# Patient Record
Sex: Female | Born: 1985 | Race: White | Hispanic: No | Marital: Married | Smoking: Former smoker
Health system: Southern US, Community
[De-identification: ages and names within clinical notes are randomized; demographics above are authoritative.]

## PROBLEM LIST (undated history)

## (undated) DIAGNOSIS — G47419 Narcolepsy without cataplexy: Secondary | ICD-10-CM

## (undated) DIAGNOSIS — R4586 Emotional lability: Secondary | ICD-10-CM

## (undated) DIAGNOSIS — Z803 Family history of malignant neoplasm of breast: Secondary | ICD-10-CM

## (undated) DIAGNOSIS — Z1371 Encounter for nonprocreative screening for genetic disease carrier status: Secondary | ICD-10-CM

## (undated) DIAGNOSIS — K5909 Other constipation: Secondary | ICD-10-CM

## (undated) DIAGNOSIS — G4711 Idiopathic hypersomnia with long sleep time: Secondary | ICD-10-CM

## (undated) DIAGNOSIS — N809 Endometriosis, unspecified: Secondary | ICD-10-CM

## (undated) DIAGNOSIS — F419 Anxiety disorder, unspecified: Secondary | ICD-10-CM

## (undated) DIAGNOSIS — Z9289 Personal history of other medical treatment: Secondary | ICD-10-CM

## (undated) DIAGNOSIS — L709 Acne, unspecified: Secondary | ICD-10-CM

## (undated) DIAGNOSIS — A6920 Lyme disease, unspecified: Secondary | ICD-10-CM

## (undated) HISTORY — DX: Family history of malignant neoplasm of breast: Z80.3

## (undated) HISTORY — DX: Encounter for nonprocreative screening for genetic disease carrier status: Z13.71

## (undated) HISTORY — DX: Acne, unspecified: L70.9

## (undated) HISTORY — PX: COSMETIC SURGERY: SHX468

## (undated) HISTORY — DX: Personal history of other medical treatment: Z92.89

## (undated) HISTORY — PX: FOOT SURGERY: SHX648

## (undated) HISTORY — DX: Narcolepsy without cataplexy: G47.419

## (undated) HISTORY — PX: ENDOMETRIAL BIOPSY: SHX622

## (undated) HISTORY — DX: Anxiety disorder, unspecified: F41.9

---

## 2005-08-29 ENCOUNTER — Ambulatory Visit: Payer: Self-pay | Admitting: Unknown Physician Specialty

## 2005-08-29 IMAGING — US US THYROID
1 series · 18 of 22 positions shown · non-contrast
Comparison: none

REASON FOR EXAM: Palpable nodule in thyroid region
COMMENTS:

PROCEDURE:     US  - US THYROID  - [DATE]  [DATE]
RESULT:     The RIGHT lobe measures 3.9 cm x 1.7 cm x 1.3 cm and the LEFT
lobe measures 4.4 cm x 1.5 cm x 1.5 cm.  The lobes bilaterally have a
homogeneous echotexture.  No thyroid nodules or masses are seen.

[Series 1: us thyroid · 18 of 22 slices shown]
[im 1/22]
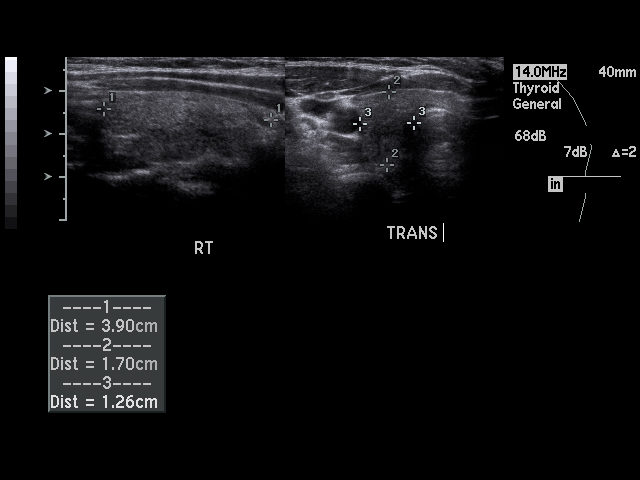
[im 2/22]
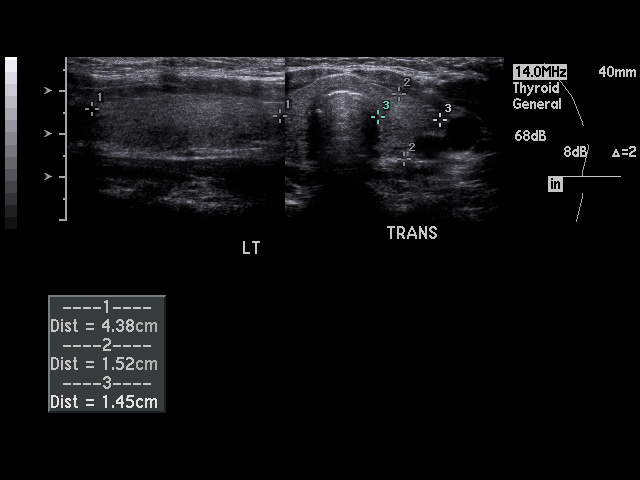
[im 4/22]
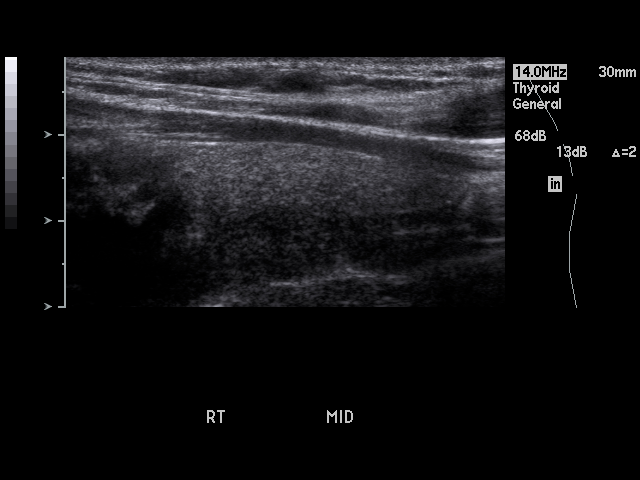
[im 5/22]
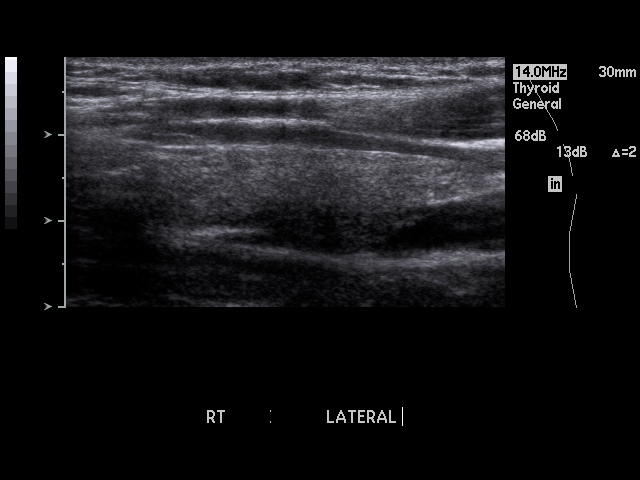
[im 6/22]
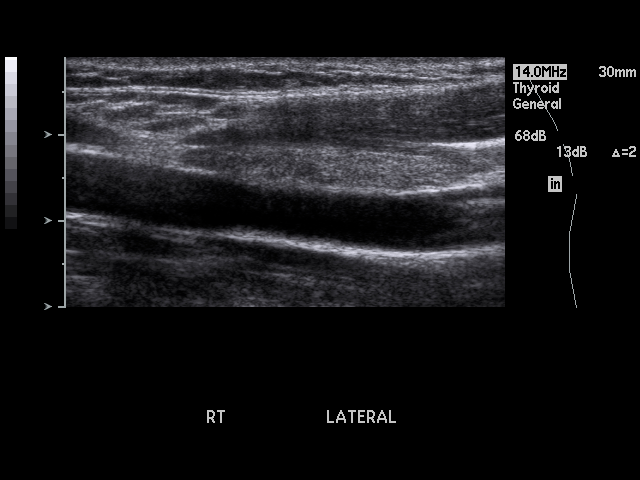
[im 7/22]
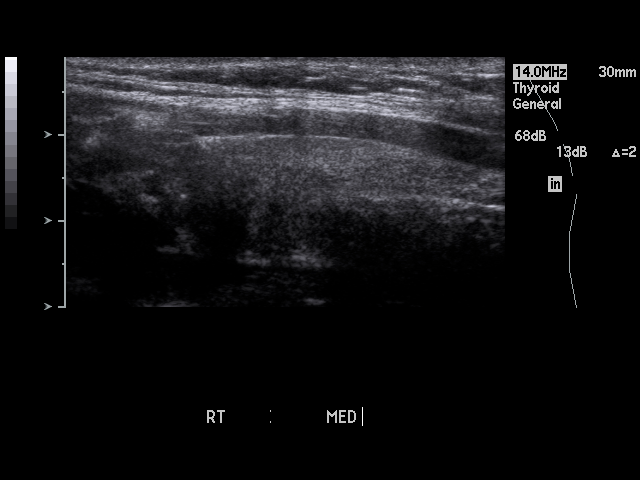
[im 8/22]
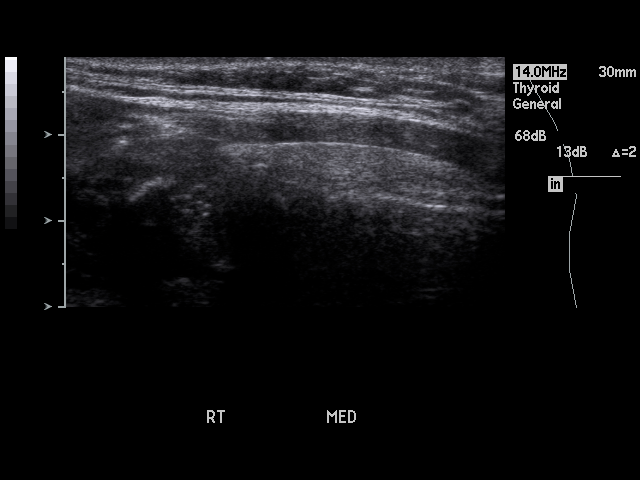
[im 10/22]
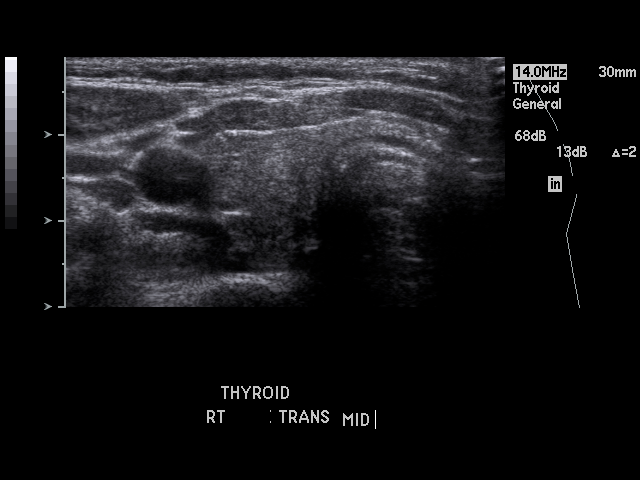
[im 11/22]
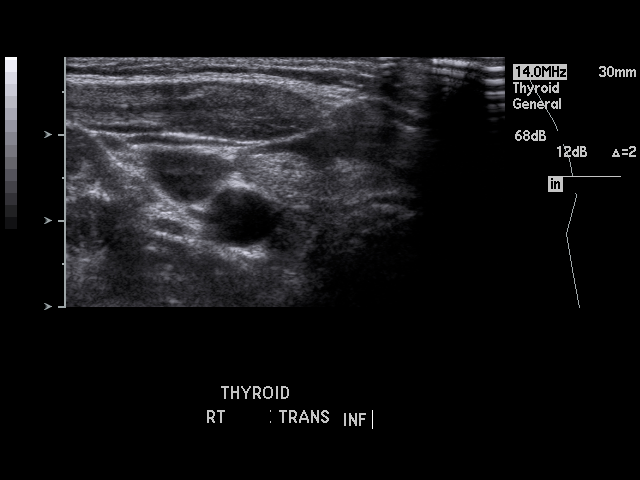
[im 12/22]
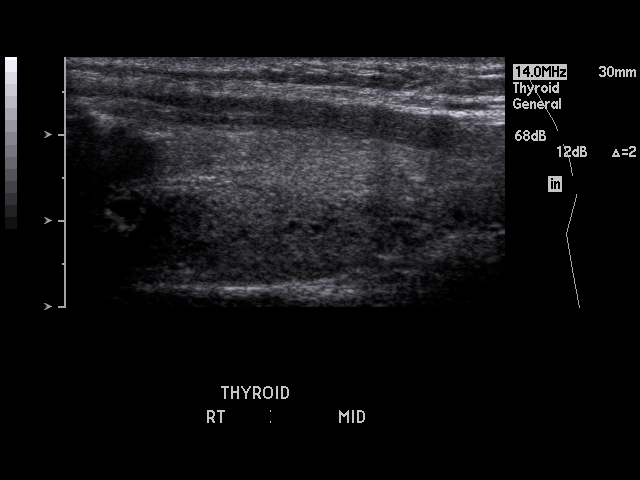
[im 13/22]
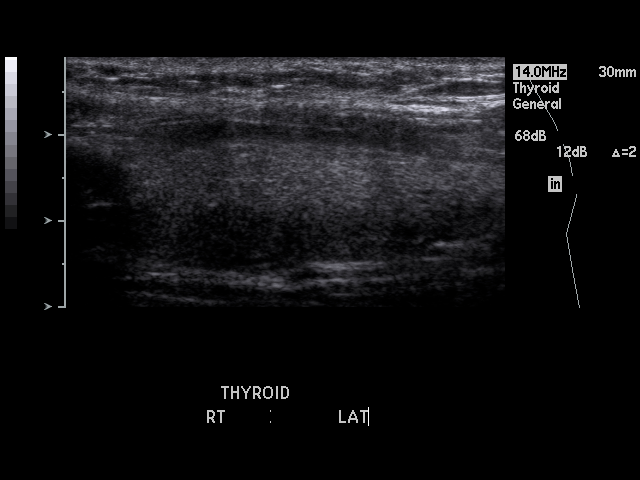
[im 15/22]
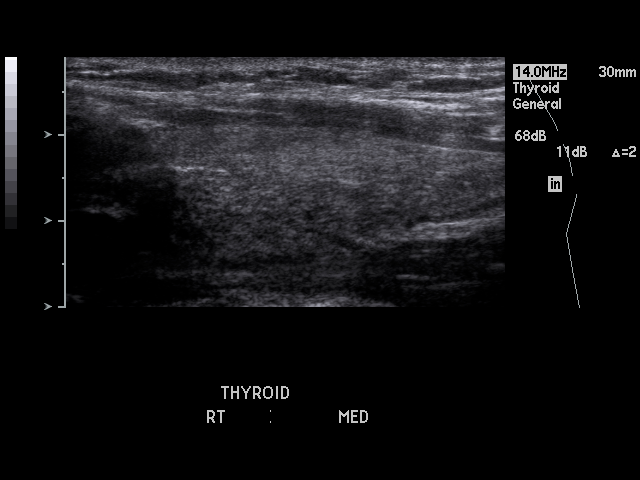
[im 16/22]
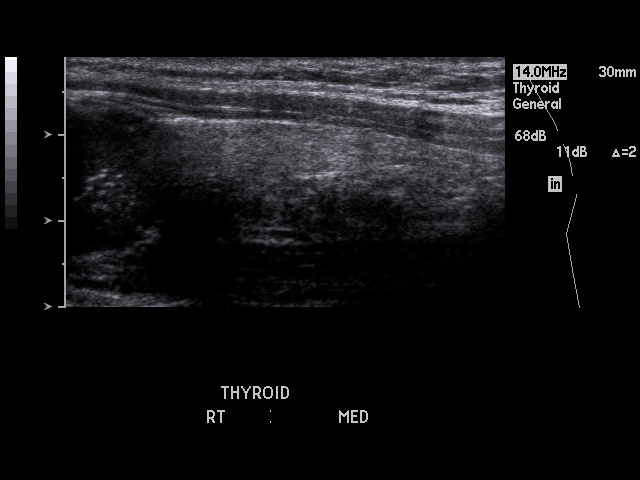
[im 17/22]
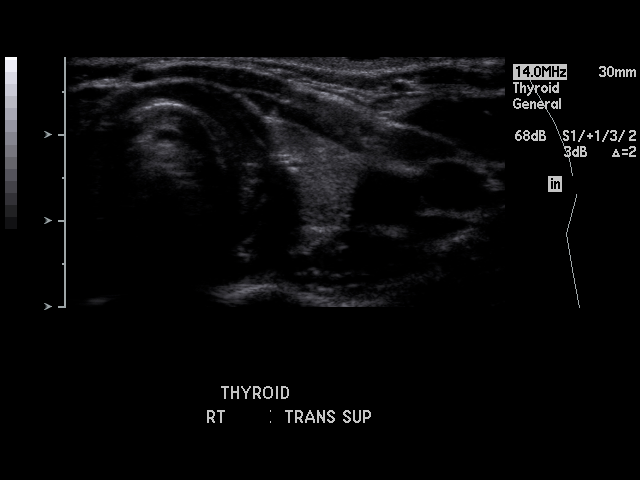
[im 18/22]
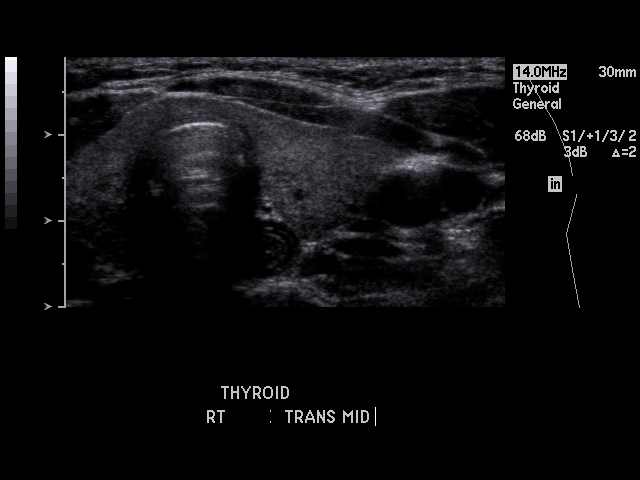
[im 19/22]
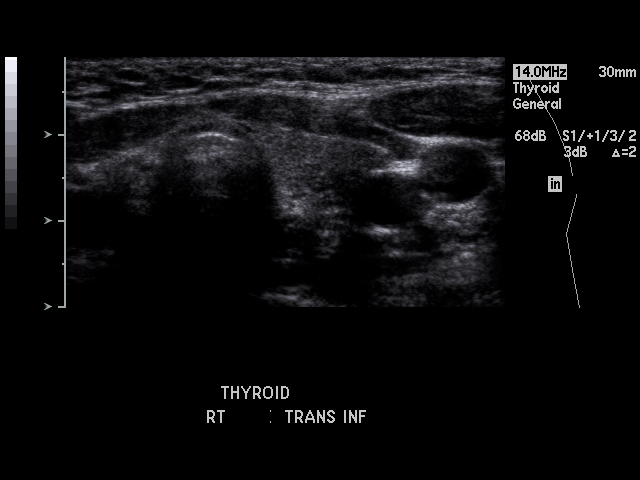
[im 21/22]
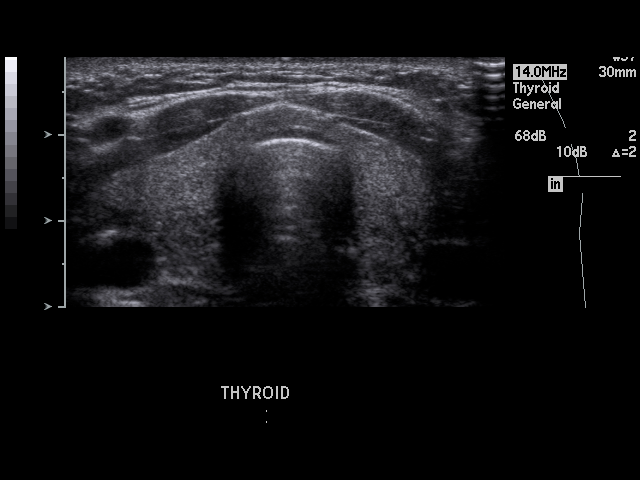
[im 22/22]
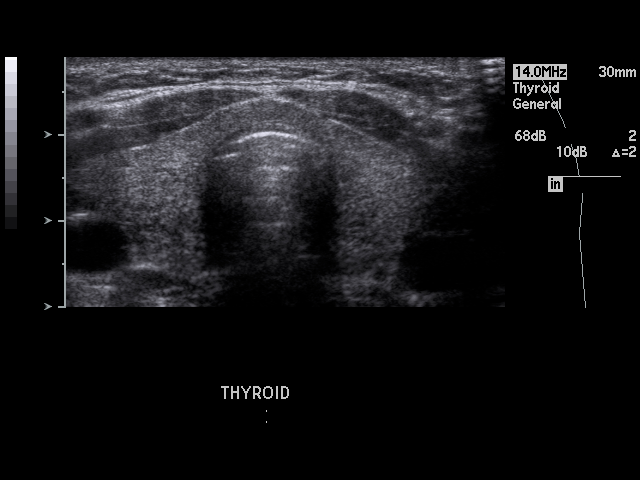

[18 of 22 positions shown; findings below may reference images not displayed]

IMPRESSION: Normal study.

## 2008-07-15 HISTORY — PX: DIAGNOSTIC LAPAROSCOPY: SUR761

## 2009-02-10 ENCOUNTER — Ambulatory Visit: Payer: Self-pay | Admitting: Obstetrics and Gynecology

## 2009-02-17 ENCOUNTER — Ambulatory Visit: Payer: Self-pay | Admitting: Obstetrics and Gynecology

## 2009-12-01 ENCOUNTER — Ambulatory Visit: Payer: Self-pay | Admitting: Gastroenterology

## 2012-03-15 DIAGNOSIS — Z803 Family history of malignant neoplasm of breast: Secondary | ICD-10-CM

## 2012-03-15 HISTORY — DX: Family history of malignant neoplasm of breast: Z80.3

## 2012-09-23 ENCOUNTER — Ambulatory Visit: Payer: Self-pay | Admitting: Obstetrics and Gynecology

## 2012-09-23 IMAGING — US ULTRASOUND RIGHT BREAST
1 series · 14 of 14 positions shown · non-contrast
Comparison: none

REASON FOR EXAM: RT BRST MASS 9 OCLOCK
COMMENTS:

[Series 1: ultrasound right breast · 0.08mm/px · 14 of 14 slices shown]
[im 1/14]
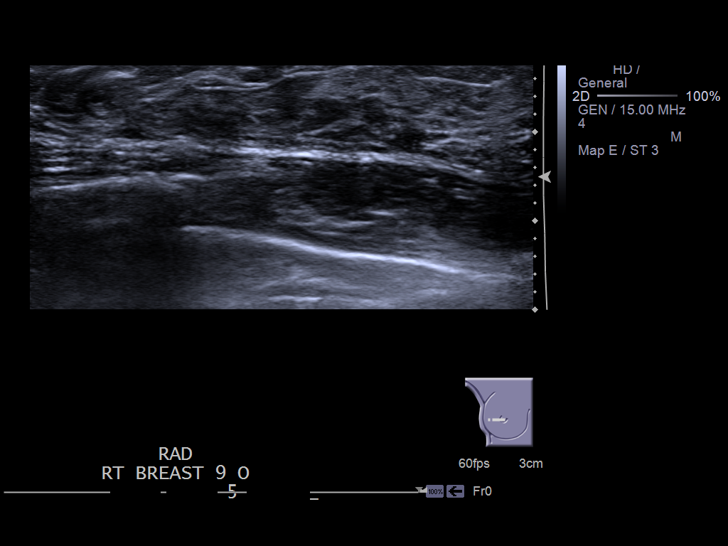
[im 2/14]
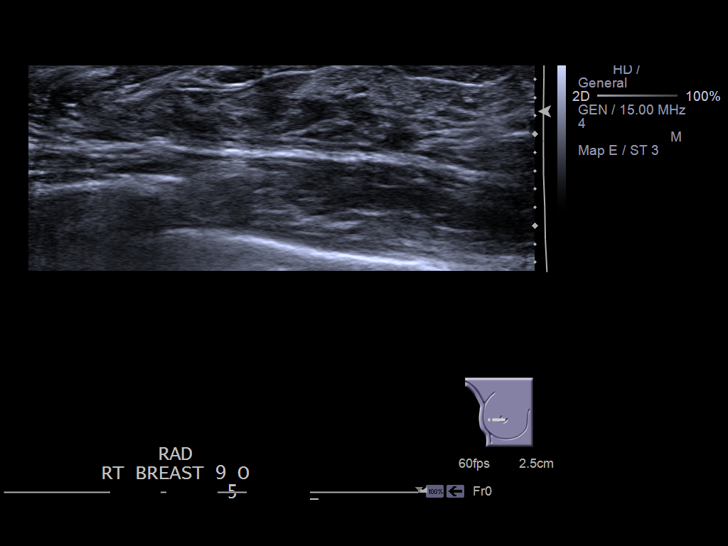
[im 3/14]
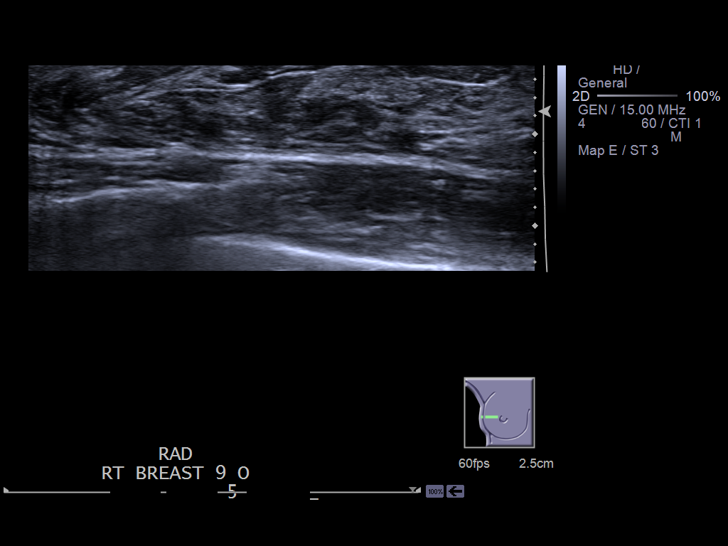
[im 4/14]
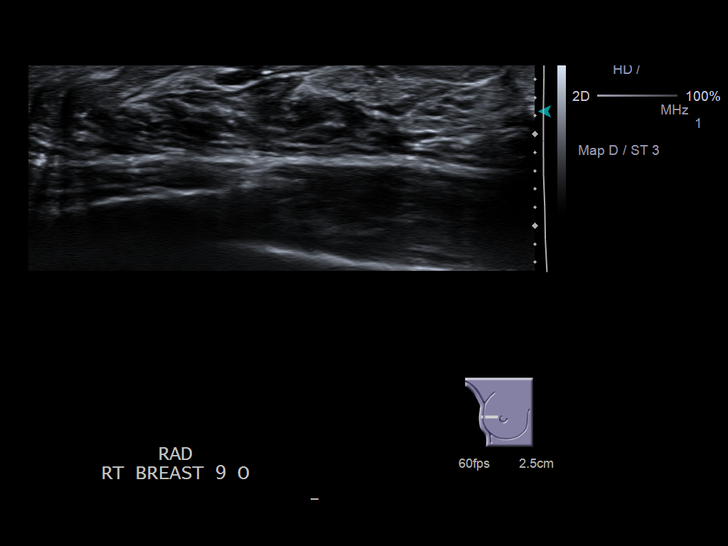
[im 5/14]
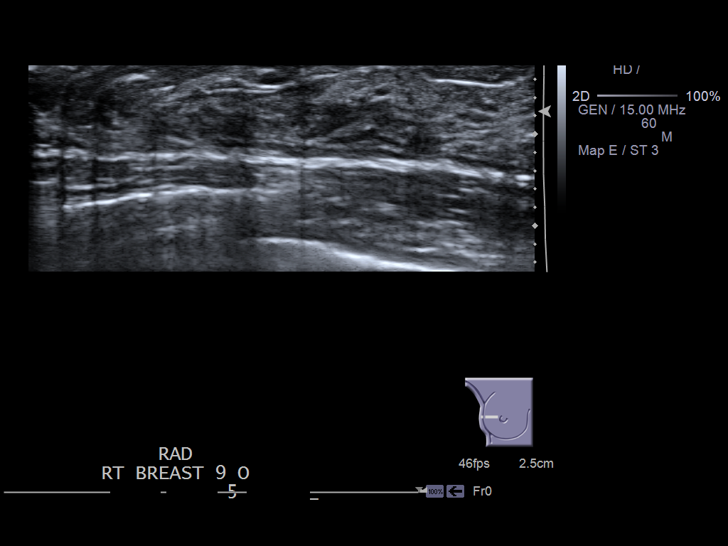
[im 6/14]
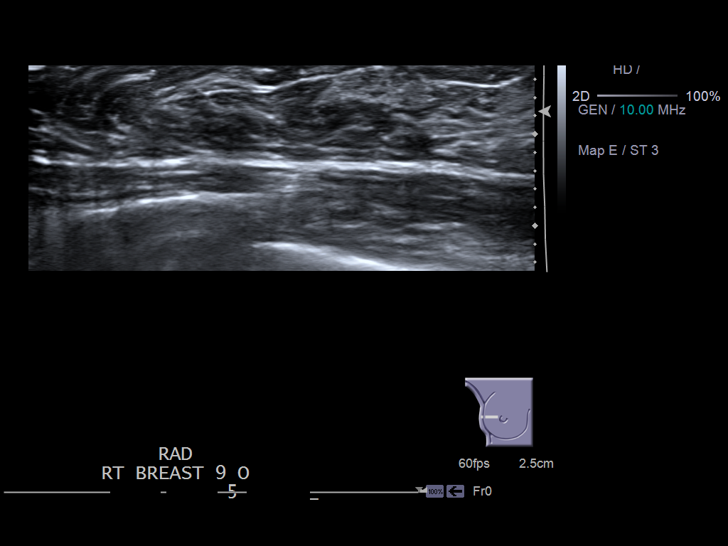
[im 7/14]
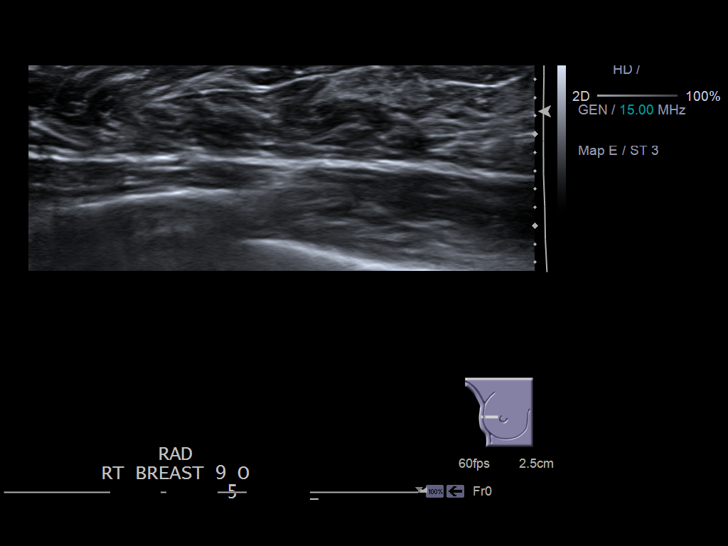
[im 8/14]
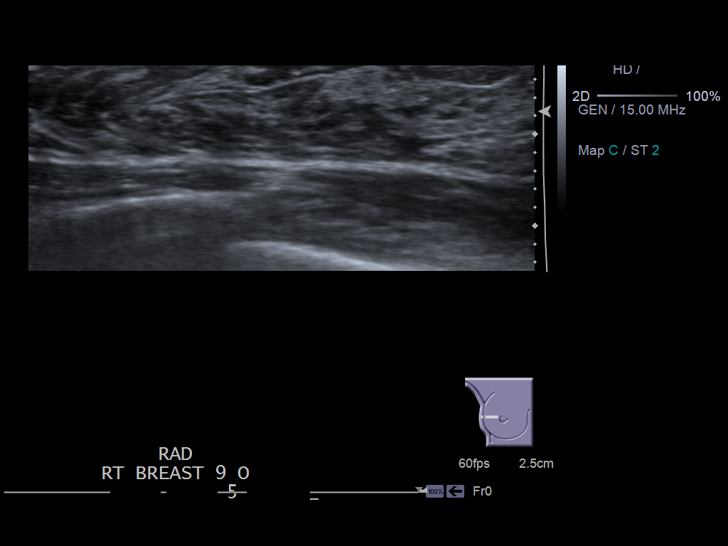
[im 9/14]
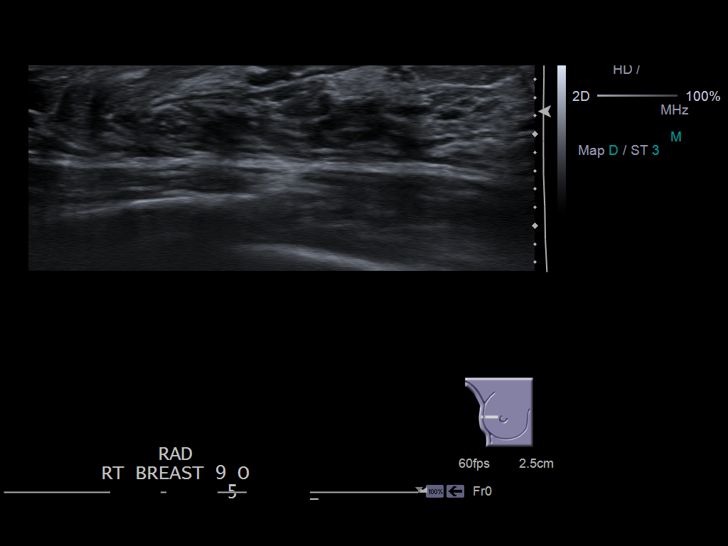
[im 10/14]
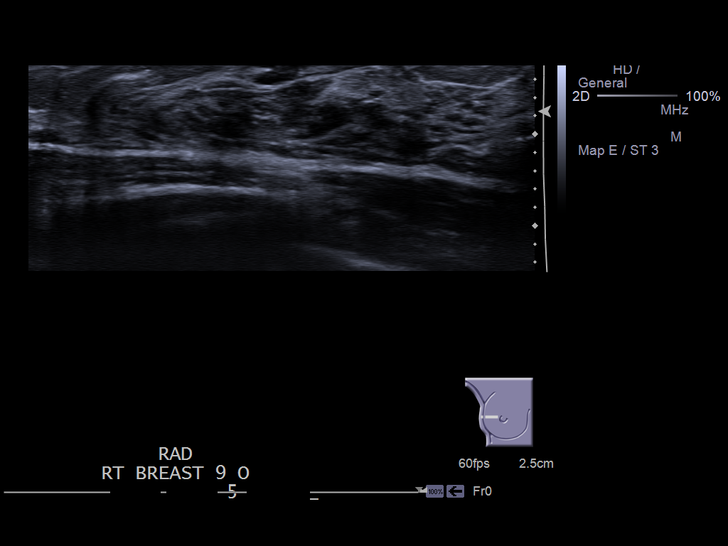
[im 11/14]
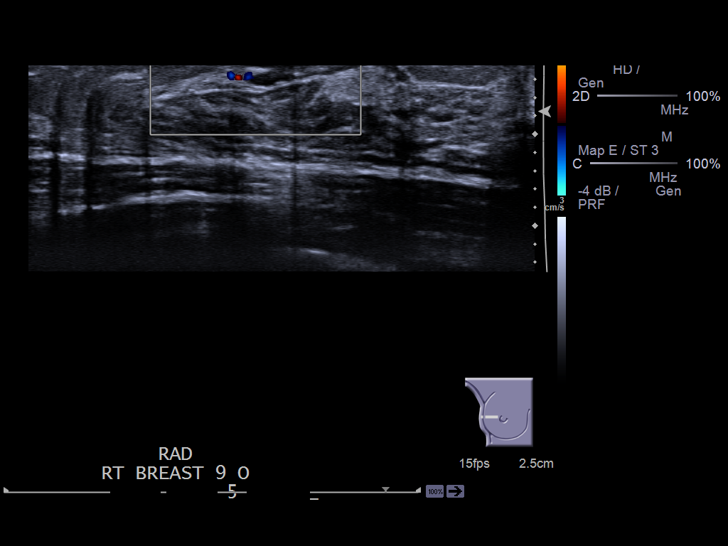
[im 12/14]
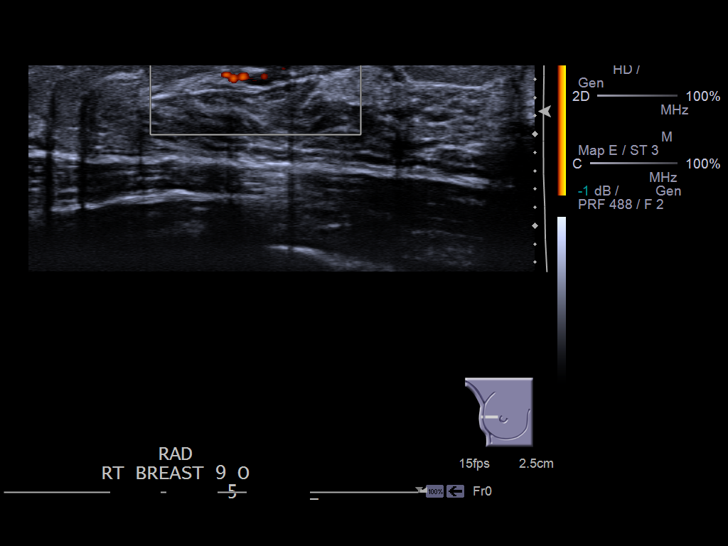
[im 13/14]
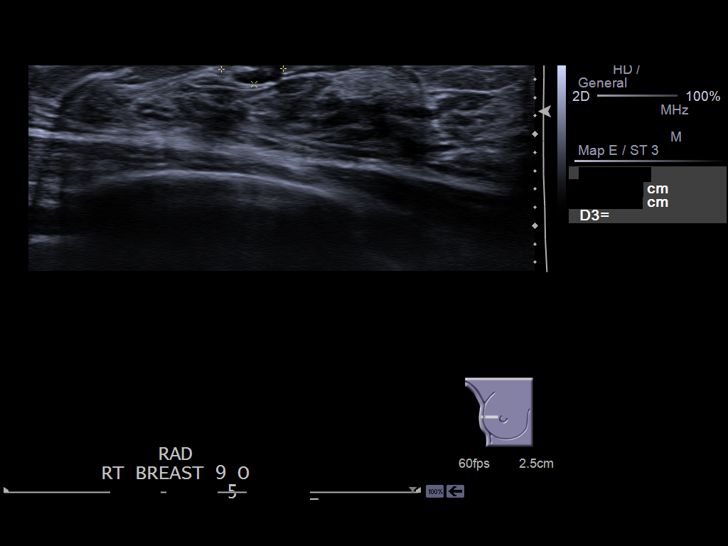
[im 14/14]
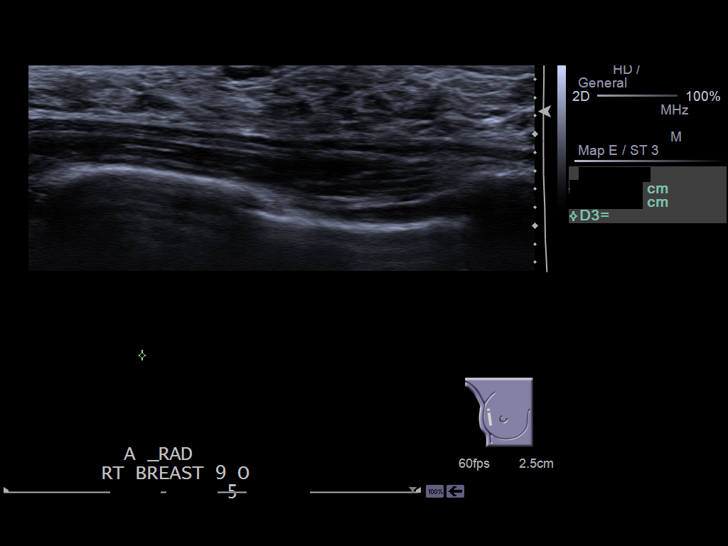

[14 of 14 positions shown; findings below may reference images not displayed]

PROCEDURE:     US  - US BREAST RIGHT  - [DATE]  [DATE]

RESULT:     Targeted ultrasound right breast in the [DATE] region 5 cm to 6 cm
from the nipple in an area of palpable concern shows a superficial area of
peripheral hypoechogenicity with some central increased echogenicity and
central or hilar blood flow suggestive of an intramammary lymph node. This
measures 0.68 x 0.29 x 0.64 cm.
IMPRESSION: Please see above.

[REDACTED]

## 2013-05-18 ENCOUNTER — Ambulatory Visit: Payer: 59 | Admitting: Podiatry

## 2014-05-17 LAB — HM PAP SMEAR: HM Pap smear: NEGATIVE

## 2014-09-01 ENCOUNTER — Ambulatory Visit: Payer: Self-pay | Admitting: Gastroenterology

## 2014-11-08 ENCOUNTER — Ambulatory Visit
Admit: 2014-11-08 | Disposition: A | Discharge status: Home / Self Care | Payer: Self-pay | Attending: Gastroenterology | Admitting: Gastroenterology

## 2014-12-05 ENCOUNTER — Encounter: Payer: Self-pay | Admitting: *Deleted

## 2014-12-06 ENCOUNTER — Encounter: Admission: RE | Payer: Self-pay | Source: Ambulatory Visit

## 2014-12-06 ENCOUNTER — Ambulatory Visit: Admission: RE | Admit: 2014-12-06 | Payer: 59 | Source: Ambulatory Visit | Admitting: Gastroenterology

## 2014-12-06 HISTORY — DX: Endometriosis, unspecified: N80.9

## 2014-12-06 HISTORY — DX: Other constipation: K59.09

## 2014-12-06 SURGERY — COLONOSCOPY WITH PROPOFOL
Anesthesia: Monitor Anesthesia Care

## 2015-07-16 HISTORY — PX: AUGMENTATION MAMMAPLASTY: SUR837

## 2015-12-05 ENCOUNTER — Other Ambulatory Visit: Payer: Self-pay | Admitting: Obstetrics & Gynecology

## 2015-12-17 ENCOUNTER — Ambulatory Visit
Admission: EM | Admit: 2015-12-17 | Discharge: 2015-12-17 | Disposition: A | Discharge status: Home / Self Care | Payer: 59 | Attending: Family Medicine | Admitting: Family Medicine

## 2015-12-17 ENCOUNTER — Encounter: Payer: Self-pay | Admitting: *Deleted

## 2015-12-17 DIAGNOSIS — H6501 Acute serous otitis media, right ear: Secondary | ICD-10-CM | POA: Diagnosis not present

## 2015-12-17 DIAGNOSIS — J069 Acute upper respiratory infection, unspecified: Secondary | ICD-10-CM | POA: Diagnosis not present

## 2015-12-17 MED ORDER — AMOXICILLIN-POT CLAVULANATE 875-125 MG PO TABS: 1.0000 | ORAL_TABLET | Freq: Two times a day (BID) | ORAL | Status: DC

## 2015-12-17 NOTE — ED Provider Notes (Signed)
CSN: [id number]     Arrival date & time 12/17/15  1201 History   First MD Initiated Contact with Patient 12/17/15 1330     Chief Complaint  Patient presents with  . Sore Throat  . Fever   HPI  Veronica Chan is a pleasant 30 y.o. female this has sore throat for the past 3 days. The pain is 6/10. It is worse with swallowing. She has tried lozenges, sprays, Tylenol, DayQuil, and NyQuil with minimal relief. She had fever with a MAXIMUM TEMPERATURE of 100.6. She has had some sinus pressure in her forehead. She has bilateral ear pain. She has a dry cough.  Past Medical History  Diagnosis Date  . Chronic constipation   . Endometriosis    Past Surgical History  Procedure Laterality Date  . Diagnostic laparoscopy     History reviewed. No pertinent family history. Social History  Substance Use Topics  . Smoking status: Former Games developer  . Smokeless tobacco: Current User  . Alcohol Use: No   OB History    No data available     Review of Systems  Constitutional: Positive for chills, activity change, appetite change and fatigue.  HENT: Positive for congestion, ear pain, sinus pressure and sore throat. Negative for hearing loss and postnasal drip.   Cardiovascular: Negative.   Gastrointestinal: Negative.   Genitourinary: Negative.   Musculoskeletal: Negative.   Skin: Negative.   Neurological: Negative.   Psychiatric/Behavioral: Negative.     Allergies  Review of patient's allergies indicates no known allergies.  Home Medications   Prior to Admission medications   Medication Sig Start Date End Date Taking? Authorizing Provider  Multiple Vitamin (MULTIVITAMIN) tablet Take 1 tablet by mouth daily.   Yes Historical Provider, MD  amoxicillin-clavulanate (AUGMENTIN) 875-125 MG tablet Take 1 tablet by mouth 2 (two) times daily. 12/17/15   Joselyn Arrow, NP  leuprolide (LUPRON) 7.5 MG injection Inject 7.5 mg into the muscle once.    Historical Provider, MD  lubiprostone (AMITIZA) 24 MCG  capsule Take 24 mcg by mouth 2 (two) times daily with a meal.    Historical Provider, MD   Meds Ordered and Administered this Visit  Medications - No data to display  BP 100/61 mmHg  Pulse 90  Temp(Src) 100.6 F (38.1 C) (Oral)  Resp 18  Ht  (1.651 m)  Wt 140 lb (63.504 kg)  BMI 23.30 kg/m2  SpO2 100%  LMP 07/19/2015 No data found.   Physical Exam  Constitutional: She is oriented to person, place, and time. She appears well-developed and well-nourished. No distress.  HENT:  Head: Normocephalic and atraumatic.  Right Ear: Hearing and ear canal normal. There is swelling. Tympanic membrane is injected, erythematous and bulging. A middle ear effusion is present.  Left Ear: Hearing, tympanic membrane, external ear and ear canal normal.  Nose: Nose normal. Right sinus exhibits no maxillary sinus tenderness and no frontal sinus tenderness. Left sinus exhibits no maxillary sinus tenderness and no frontal sinus tenderness.  Mouth/Throat: Uvula is midline and mucous membranes are normal. Posterior oropharyngeal erythema present. No oropharyngeal exudate, posterior oropharyngeal edema or tonsillar abscesses.  Eyes: Conjunctivae are normal. No scleral icterus.  Neck: Normal range of motion.  Cardiovascular: Normal rate and regular rhythm.   Pulmonary/Chest: Effort normal and breath sounds normal. No respiratory distress.  Abdominal: Soft. Bowel sounds are normal. She exhibits no distension.  Musculoskeletal: Normal range of motion. She exhibits no edema or tenderness.  Neurological: She is alert and  oriented to person, place, and time. No cranial nerve deficit.  Skin: Skin is warm and dry. No rash noted. No erythema. Nails show no clubbing.  Psychiatric: Her behavior is normal. Judgment normal.  Nursing note and vitals reviewed.   ED Course  Procedures n/a  Labs Review Labs Reviewed - No data to display  MDM   1. Right acute serous otitis media, recurrence not specified   2.  URI, acute    Plan: Diagnosis reviewed with patient/parent Rx as per orders;  benefits, risks, potential side effects reviewed  Recommend supportive treatment with rest, increase fluids, note given for work as patient was supposed to fly tomorrow morning for her job. She can return on 6/7.   Ibuprofen 600 mg 3 times a day for pain  Seek additional medical care if symptoms worsen or are not improving     Joselyn ArrowKandice L Jeniece Hannis, NP 12/17/15 1435

## 2015-12-17 NOTE — ED Notes (Signed)
Patient started having sore throat with fever 3 days ago. Additional symptom of headache, and facial pain present.

## 2016-02-05 ENCOUNTER — Encounter: Admission: RE | Payer: Self-pay | Source: Ambulatory Visit

## 2016-02-05 ENCOUNTER — Ambulatory Visit: Admission: RE | Admit: 2016-02-05 | Payer: 59 | Source: Ambulatory Visit | Admitting: Gastroenterology

## 2016-02-05 SURGERY — COLONOSCOPY WITH PROPOFOL
Anesthesia: General

## 2016-04-22 ENCOUNTER — Encounter: Payer: Self-pay | Admitting: *Deleted

## 2016-04-23 ENCOUNTER — Encounter
Admission: RE | Disposition: A | Discharge status: Home / Self Care | Payer: Self-pay | Source: Ambulatory Visit | Attending: Gastroenterology

## 2016-04-23 ENCOUNTER — Ambulatory Visit: Payer: 59 | Admitting: Anesthesiology

## 2016-04-23 ENCOUNTER — Ambulatory Visit
Admission: RE | Admit: 2016-04-23 | Discharge: 2016-04-23 | Disposition: A | Discharge status: Home / Self Care | Payer: 59 | Source: Ambulatory Visit | Attending: Gastroenterology | Admitting: Gastroenterology

## 2016-04-23 ENCOUNTER — Encounter: Payer: Self-pay | Admitting: *Deleted

## 2016-04-23 DIAGNOSIS — R1084 Generalized abdominal pain: Secondary | ICD-10-CM | POA: Diagnosis not present

## 2016-04-23 DIAGNOSIS — K5909 Other constipation: Secondary | ICD-10-CM | POA: Diagnosis present

## 2016-04-23 DIAGNOSIS — Q438 Other specified congenital malformations of intestine: Secondary | ICD-10-CM | POA: Diagnosis not present

## 2016-04-23 DIAGNOSIS — Z79899 Other long term (current) drug therapy: Secondary | ICD-10-CM | POA: Diagnosis not present

## 2016-04-23 DIAGNOSIS — K625 Hemorrhage of anus and rectum: Secondary | ICD-10-CM | POA: Insufficient documentation

## 2016-04-23 DIAGNOSIS — Z87891 Personal history of nicotine dependence: Secondary | ICD-10-CM | POA: Insufficient documentation

## 2016-04-23 DIAGNOSIS — K6289 Other specified diseases of anus and rectum: Secondary | ICD-10-CM | POA: Insufficient documentation

## 2016-04-23 HISTORY — PX: COLONOSCOPY WITH PROPOFOL: SHX5780

## 2016-04-23 HISTORY — DX: Emotional lability: R45.86

## 2016-04-23 LAB — POCT PREGNANCY, URINE: Preg Test, Ur: NEGATIVE

## 2016-04-23 SURGERY — COLONOSCOPY WITH PROPOFOL
Anesthesia: General

## 2016-04-23 MED ORDER — MIDAZOLAM HCL 2 MG/2ML IJ SOLN
INTRAMUSCULAR | Status: DC | PRN
  Administered 2016-04-23: 2 mg via INTRAVENOUS

## 2016-04-23 MED ORDER — SODIUM CHLORIDE 0.9 % IV SOLN: INTRAVENOUS | Status: DC

## 2016-04-23 MED ORDER — SODIUM CHLORIDE 0.9 % IV SOLN
INTRAVENOUS | Status: DC
  Administered 2016-04-23: 15:00:00 via INTRAVENOUS
  Administered 2016-04-23: 1000 mL via INTRAVENOUS

## 2016-04-23 MED ORDER — PROPOFOL 500 MG/50ML IV EMUL
INTRAVENOUS | Status: DC | PRN
Start: 2016-04-23 — End: 2016-04-23
  Administered 2016-04-23: 140 ug/kg/min via INTRAVENOUS

## 2016-04-23 MED ORDER — PROPOFOL 10 MG/ML IV BOLUS
INTRAVENOUS | Status: DC | PRN
  Administered 2016-04-23: 100 mg via INTRAVENOUS

## 2016-04-23 NOTE — Anesthesia Preprocedure Evaluation (Signed)
Anesthesia Evaluation  Patient identified by MRN, date of birth, ID band Patient awake    Reviewed: Allergy & Precautions, NPO status , Patient's Chart, lab work & pertinent test results  Airway Mallampati: II       Dental no notable dental hx.    Pulmonary former smoker,    Pulmonary exam normal        Cardiovascular negative cardio ROS Normal cardiovascular exam     Neuro/Psych Mood swingsnegative neurological ROS     GI/Hepatic Neg liver ROS, Chronic constipation   Endo/Other  negative endocrine ROS  Renal/GU negative Renal ROS     Musculoskeletal negative musculoskeletal ROS (+)   Abdominal Normal abdominal exam  (+)   Peds negative pediatric ROS (+)  Hematology negative hematology ROS (+)   Anesthesia Other Findings endometriosis  Reproductive/Obstetrics                             Anesthesia Physical Anesthesia Plan  ASA: II  Anesthesia Plan: General   Post-op Pain Management:    Induction: Intravenous  Airway Management Planned: Nasal Cannula  Additional Equipment:   Intra-op Plan:   Post-operative Plan:   Informed Consent: I have reviewed the patients History and Physical, chart, labs and discussed the procedure including the risks, benefits and alternatives for the proposed anesthesia with the patient or authorized representative who has indicated his/her understanding and acceptance.   Dental advisory given  Plan Discussed with: CRNA and Surgeon  Anesthesia Plan Comments:         Anesthesia Quick Evaluation

## 2016-04-23 NOTE — H&P (Signed)
Outpatient short stay form Pre-procedure 04/23/2016 3:20 PM Veronica DeemMartin U Skulskie MD  Primary Physician: Veronica MangoElizabeth White NP  Reason for visit:  Colonoscopy  History of present illness:  Patient is a 30 year old female presenting today as above. She has a history of generalized abdominal discomfort and chronic constipation. About a month ago she had several weeks of rectal bleeding. This is subsequently resolved. She currently does get good relief with taking MiraLAX and has a bowel movement every other day taking that. Otherwise she would have a bowel movement about once a week. She denies use of any aspirin or blood thinning agents.    Current Facility-Administered Medications:  .  0.9 %  sodium chloride infusion, , Intravenous, Continuous, Veronica DeemMartin U Skulskie, MD, Last Rate: 20 mL/hr at 04/23/16 1410, 1,000 mL at 04/23/16 1410 .  0.9 %  sodium chloride infusion, , Intravenous, Continuous, Veronica DeemMartin U Skulskie, MD  Prescriptions Prior to Admission  Medication Sig Dispense Refill Last Dose  . leuprolide (LUPRON) 7.5 MG injection Inject 7.5 mg into the muscle once.   Past Month at Unknown time  . methylphenidate (RITALIN) 20 MG tablet Take 20 mg by mouth 2 (two) times daily.   Past Week at Unknown time  . Multiple Vitamin (MULTIVITAMIN) tablet Take 1 tablet by mouth daily.   Past Week at Unknown time  . Probiotic Product (ALIGN PO) Take 1 tablet by mouth daily.   Past Week at Unknown time  . amoxicillin-clavulanate (AUGMENTIN) 875-125 MG tablet Take 1 tablet by mouth 2 (two) times daily. (Patient not taking: Reported on 04/23/2016) 20 tablet 0 Completed Course at Unknown time  . budesonide (RHINOCORT ALLERGY) 32 MCG/ACT nasal spray Place 2 sprays into both nostrils daily.   Completed Course at Unknown time  . lubiprostone (AMITIZA) 24 MCG capsule Take 24 mcg by mouth 2 (two) times daily with a meal.   Not Taking at Unknown time  . modafinil (PROVIGIL) 100 MG tablet Take 100 mg by mouth daily.   Not  Taking at Unknown time     No Known Allergies   Past Medical History:  Diagnosis Date  . Chronic constipation   . Endometriosis   . Mood swings (HCC)     Review of systems:      Physical Exam    Heart and lungs: Regular rate and rhythm without rub or gallop, lungs are bilaterally clear.    HEENT: Normocephalic atraumatic eyes are anicteric    Other:     Pertinant exam for procedure: Soft nontender nondistended bowel sounds positive normoactive.    Planned proceedures: Colonoscopy and indicated procedures. I have discussed the risks benefits and complications of procedures to include not limited to bleeding, infection, perforation and the risk of sedation and the patient wishes to proceed.    Veronica DeemMartin U Skulskie, MD Gastroenterology 04/23/2016  3:20 PM

## 2016-04-23 NOTE — Op Note (Signed)
[facility] Gastroenterology Patient Name: Veronica Chan Procedure Date: 04/23/2016 3:21 PM MRN: [id number] Account #: [id number] Date of Birth: 1986-01-20 Admit Type: Outpatient Age: 30 Room: [facility] ENDO ROOM 3 Gender: Female Note Status: Finalized Procedure:            Colonoscopy Indications:          Generalized abdominal pain, Rectal bleeding Providers:            Christena DeemMartin U. Skulskie, MD Referring MD:         Courtney ParisElizabeth B. Cliffton AstersWhite, MD (Referring MD) Medicines:            Monitored Anesthesia Care Complications:        No immediate complications. Procedure:            Pre-Anesthesia Assessment:                       - ASA Grade Assessment: II - A patient with mild                        systemic disease.                       After obtaining informed consent, the colonoscope was                        passed under direct vision. Throughout the procedure,                        the patient's blood pressure, pulse, and oxygen                        saturations were monitored continuously. The                        Colonoscope was introduced through the anus and                        advanced to the the cecum, identified by appendiceal                        orifice and ileocecal valve. The colonoscopy was                        unusually difficult due to a tortuous colon. Successful                        completion of the procedure was aided by changing the                        patient to a supine position and using manual pressure.                        The quality of the bowel preparation was good. Findings:      Diffuse mild inflammation characterized by congestion (edema) and       granularity was found in the rectum. Biopsies for histology were taken       with a cold forceps from the left colon and rectum for evaluation of       microscopic colitis.      The colon (entire examined portion) was significantly redundant.  The digital rectal exam was  normal. Impression:           - Diffuse mild inflammation was found in the rectum                        secondary to proctitis. Biopsied.                       - Redundant colon. Recommendation:       - Discharge patient to home.                       - Telephone GI clinic for pathology results in 1 week.                       - Await pathology results.                       - Return to GI clinic in 3 weeks.                       - Continue present medications. Procedure Code(s):    --- Professional ---                       [id number], Colonoscopy, flexible; with biopsy, single or                        multiple Diagnosis Code(s):    --- Professional ---                       K62.89, Other specified diseases of anus and rectum                       R10.84, Generalized abdominal pain                       K62.5, Hemorrhage of anus and rectum                       Q43.8, Other specified congenital malformations of                        intestine CPT copyright 2016 American Medical Association. All rights reserved. The codes documented in this report are preliminary and upon coder review may  be revised to meet current compliance requirements. Christena Deem, MD 04/23/2016 4:08:27 PM This report has been signed electronically. Number of Addenda: 0 Note Initiated On: 04/23/2016 3:21 PM Scope Withdrawal Time: 0 hours 14 minutes 54 seconds  Total Procedure Duration: 0 hours 33 minutes 41 seconds       [facility]

## 2016-04-23 NOTE — Transfer of Care (Signed)
Immediate Anesthesia Transfer of Care Note  Patient: Veronica Chan  Procedure(s) Performed: Procedure(s): COLONOSCOPY WITH PROPOFOL (N/A)  Patient Location: PACU and Endoscopy Unit  Anesthesia Type:General  Level of Consciousness: patient cooperative and lethargic  Airway & Oxygen Therapy: Patient Spontanous Breathing and Patient connected to nasal cannula oxygen  Post-op Assessment: Report given to RN and Post -op Vital signs reviewed and stable  Post vital signs: Reviewed and stable  Last Vitals:  Vitals:   04/23/16 1357 04/23/16 1616  BP: 96/62 103/62  Pulse: (!) 56 74  Resp: 16 18  Temp: 37.1 C     Last Pain:  Vitals:   04/23/16 1357  TempSrc: Oral         Complications: no anesthetic complications

## 2016-04-23 NOTE — Anesthesia Postprocedure Evaluation (Signed)
Anesthesia Post Note  Patient: Veronica Chan  Procedure(s) Performed: Procedure(s) (LRB): COLONOSCOPY WITH PROPOFOL (N/A)  Patient location during evaluation: Endoscopy Anesthesia Type: General Level of consciousness: awake and alert Pain management: pain level controlled Vital Signs Assessment: post-procedure vital signs reviewed and stable Respiratory status: spontaneous breathing, nonlabored ventilation, respiratory function stable and patient connected to nasal cannula oxygen Cardiovascular status: blood pressure returned to baseline and stable Postop Assessment: no signs of nausea or vomiting Anesthetic complications: no    Last Vitals:  Vitals:   04/23/16 1636 04/23/16 1646  BP: 113/67 101/84  Pulse:    Resp:    Temp:      Last Pain:  Vitals:   04/23/16 1616  TempSrc: Tympanic                 Cleda MccreedyJoseph K Mozell Hardacre

## 2016-04-24 ENCOUNTER — Encounter: Payer: Self-pay | Admitting: Gastroenterology

## 2016-04-26 LAB — SURGICAL PATHOLOGY

## 2016-12-24 ENCOUNTER — Ambulatory Visit (INDEPENDENT_AMBULATORY_CARE_PROVIDER_SITE_OTHER): Payer: 59 | Admitting: Obstetrics and Gynecology

## 2016-12-24 ENCOUNTER — Encounter: Payer: Self-pay | Admitting: Obstetrics and Gynecology

## 2016-12-24 VITALS — BP 118/72 | Ht 65.0 in | Wt 145.0 lb

## 2016-12-24 DIAGNOSIS — Z124 Encounter for screening for malignant neoplasm of cervix: Secondary | ICD-10-CM

## 2016-12-24 DIAGNOSIS — Z803 Family history of malignant neoplasm of breast: Secondary | ICD-10-CM

## 2016-12-24 DIAGNOSIS — G8929 Other chronic pain: Secondary | ICD-10-CM

## 2016-12-24 DIAGNOSIS — Z01419 Encounter for gynecological examination (general) (routine) without abnormal findings: Secondary | ICD-10-CM

## 2016-12-24 DIAGNOSIS — Z1239 Encounter for other screening for malignant neoplasm of breast: Secondary | ICD-10-CM

## 2016-12-24 DIAGNOSIS — Z1231 Encounter for screening mammogram for malignant neoplasm of breast: Secondary | ICD-10-CM | POA: Diagnosis not present

## 2016-12-24 DIAGNOSIS — Z1151 Encounter for screening for human papillomavirus (HPV): Secondary | ICD-10-CM | POA: Diagnosis not present

## 2016-12-24 DIAGNOSIS — Z9189 Other specified personal risk factors, not elsewhere classified: Secondary | ICD-10-CM

## 2016-12-24 DIAGNOSIS — R1032 Left lower quadrant pain: Secondary | ICD-10-CM

## 2016-12-24 NOTE — Progress Notes (Signed)
Chief Complaint  Patient presents with  . Annual Exam     HPI:      [name] is a 31 y.o. G0P0000 who LMP was Patient's last menstrual period was 11/29/2016., presents today for her annual examination.  Her menses are regular every 28-30 days, lasting 5 days.  Dysmenorrhea moderate, occurring 2 weeks each month. She does not have intermenstrual bleeding. She has a hx of endometriosis (diagnosed on dx lap 2010 at [facility]) and has pressure/pain the week before and during her period. She did lupron for 6 months summer 2107. Sx improved with treatment. Pt doesn't like the way she feels on OCPs, so never started f/u med.  She also cont to have frequent, random LLQ pain, worse with constipation. She has severe constipation and was seen by GI with neg colonoscopy. She has tried multiple meds but is currently doing miralax and fiber. She had a neg CT scan 2 yrs ago except for filled bowel. She had a neg u/s 1/17 with [place]. She saw another MD who was concerned about poss scar tissue as etiology. Pt's quality of life is affected by LLQ pains and GI issues.   Sex activity: not sexually active.  Last Pap: May 17, 2014  Results were: no abnormalities  Hx of STDs: none  Last mammogram: never There is a FH of breast cancer in her mom and MGM. Pt is BRCA neg and her mom was My Risk neg. IBIS=34%. She has not had a recent mammo. She takes Vit D supp. There is no FH of ovarian cancer. The patient does do self-breast exams.  Tobacco use: The patient denies current or previous tobacco use. Alcohol use: none Exercise: moderately active  She does get adequate calcium and Vitamin D in her diet.    Past Medical History:  Diagnosis Date  . Acne   . BRCA negative   . Chronic constipation   . Endometriosis   . Family history of breast cancer 03/2012   Pt's IBIS=34.2%10/15/  Pt's mom is MyRisk neg.  Marland Kitchen History of Papanicolaou smear of cervix 04/02/2013; 05/17/2014   neg; neg  . Mood swings ([place])    . Narcolepsy     Past Surgical History:  Procedure Laterality Date  . COLONOSCOPY WITH PROPOFOL N/A 04/23/2016   Procedure: COLONOSCOPY WITH PROPOFOL;  Surgeon: Lollie Sails, MD;  Location: [facility] ENDOSCOPY;  Service: Endoscopy;  Laterality: N/A;  . DIAGNOSTIC LAPAROSCOPY  2010   DR. SCHERMERHORN - ENDOMETRIOSIS  . ENDOMETRIAL BIOPSY      Family History  Problem Relation Age of Onset  . Breast cancer Mother 66       TAH/BSO BEFORE BR CA, IS MyRisk NEG  . Colon polyps Mother   . Thyroid cancer Mother   . Breast cancer Maternal Grandmother 29  . Cancer Paternal Grandfather        lung    Social History   Social History  . Marital status: Single    Spouse name: N/A  . Number of children: N/A  . Years of education: 95   Occupational History  .  [employer]   Social History Main Topics  . Smoking status: Former Research scientist (life sciences)  . Smokeless tobacco: Former Systems developer  . Alcohol use Yes  . Drug use: No  . Sexual activity: Yes    Birth control/ protection: None   Other Topics Concern  . Not on file   Social History Narrative  . No narrative on file  Current Outpatient Prescriptions:  .  methylphenidate (RITALIN) 20 MG tablet, Take 20 mg by mouth 2 (two) times daily., Disp: , Rfl:  .  Multiple Vitamin (MULTIVITAMIN) tablet, Take 1 tablet by mouth daily., Disp: , Rfl:  .  amoxicillin-clavulanate (AUGMENTIN) 875-125 MG tablet, Take 1 tablet by mouth 2 (two) times daily. (Patient not taking: Reported on 04/23/2016), Disp: 20 tablet, Rfl: 0 .  budesonide (RHINOCORT ALLERGY) 32 MCG/ACT nasal spray, Place 2 sprays into both nostrils daily., Disp: , Rfl:  .  leuprolide (LUPRON) 7.5 MG injection, Inject 7.5 mg into the muscle once., Disp: , Rfl:  .  lubiprostone (AMITIZA) 24 MCG capsule, Take 24 mcg by mouth 2 (two) times daily with a meal., Disp: , Rfl:  .  modafinil (PROVIGIL) 100 MG tablet, Take 100 mg by mouth daily., Disp: , Rfl:  .  Probiotic Product (ALIGN PO), Take 1 tablet by  mouth daily., Disp: , Rfl:   ROS:  Review of Systems  Constitutional: Negative for fatigue, fever and unexpected weight change.  Respiratory: Negative for cough, shortness of breath and wheezing.   Cardiovascular: Negative for chest pain, palpitations and leg swelling.  Gastrointestinal: Positive for constipation. Negative for blood in stool, diarrhea, nausea and vomiting.  Endocrine: Negative for cold intolerance, heat intolerance and polyuria.  Genitourinary: Positive for pelvic pain. Negative for dyspareunia, dysuria, flank pain, frequency, genital sores, hematuria, menstrual problem, urgency, vaginal bleeding, vaginal discharge and vaginal pain.  Musculoskeletal: Negative for back pain, joint swelling and myalgias.  Skin: Negative for rash.  Neurological: Negative for dizziness, syncope, light-headedness, numbness and headaches.  Hematological: Negative for adenopathy.  Psychiatric/Behavioral: Negative for agitation, confusion, sleep disturbance and suicidal ideas. The patient is not nervous/anxious.      Objective: BP 118/72   Ht '5\' 5"'$  (1.651 m)   Wt 145 lb (65.8 kg)   LMP 11/29/2016   BMI 24.13 kg/m    Physical Exam  Constitutional: She is oriented to person, place, and time. She appears well-developed and well-nourished.  Genitourinary: Vagina normal and uterus normal. There is no rash or tenderness on the right labia. There is no rash or tenderness on the left labia. No erythema or tenderness in the vagina. No vaginal discharge found. Right adnexum does not display mass and does not display tenderness. Left adnexum does not display mass and does not display tenderness. Cervix does not exhibit motion tenderness or polyp. Uterus is not enlarged or tender.  Neck: Normal range of motion. No thyromegaly present.  Cardiovascular: Normal rate, regular rhythm and normal heart sounds.   No murmur heard. Pulmonary/Chest: Effort normal and breath sounds normal. Right breast exhibits no  mass, no nipple discharge, no skin change and no tenderness. Left breast exhibits no mass, no nipple discharge, no skin change and no tenderness.  Abdominal: Soft. There is tenderness in the right lower quadrant, suprapubic area and left lower quadrant. There is no guarding.  Musculoskeletal: Normal range of motion.  Neurological: She is alert and oriented to person, place, and time. No cranial nerve deficit.  Psychiatric: She has a normal mood and affect. Her behavior is normal.  Vitals reviewed.   Assessment/Plan: Encounter for annual routine gynecological examination  Cervical cancer screening - Plan: IGP, Aptima HPV  Screening for HPV (human papillomavirus) - Plan: IGP, Aptima HPV  Screening for breast cancer - Pt to sched 3 D mamm[facility] at [facility]. - Plan: MM SCREENING BREAST TOMO BILATERAL  Family history of breast cancer - Plan: MM SCREENING BREAST  TOMO BILATERAL  Increased risk of breast cancer - Pt is BRCA neg/pt's mom is My RIsk neg. IBIS=34%. Rec QMO SBE, yearly CBE mammo and scr breast MRI, Vit D supp. Sched mammo first. F/u for MRI prn.  Chronic LLQ pain - F/u wtih Dr. Kenton Kingfisher re: LLQ pain and dx lap for further eval.              GYN counsel breast self exam, mammography screening, adequate intake of calcium and vitamin D, diet and exercise     F/U  Return in about 1 week (around 12/31/2016) for With Dr. Kenton Kingfisher re: LLQ pain/dx lap.  Nirali Magouirk B. Ife Vitelli, PA-C 12/24/2016 4:05 PM

## 2016-12-28 LAB — IGP, APTIMA HPV
HPV Aptima: POSITIVE — AB
PAP Smear Comment: 0

## 2017-01-28 ENCOUNTER — Encounter: Payer: Self-pay | Admitting: Obstetrics & Gynecology

## 2017-01-28 ENCOUNTER — Ambulatory Visit (INDEPENDENT_AMBULATORY_CARE_PROVIDER_SITE_OTHER): Payer: 59 | Admitting: Obstetrics & Gynecology

## 2017-01-28 VITALS — BP 100/60 | HR 59 | Ht 65.0 in | Wt 141.0 lb

## 2017-01-28 DIAGNOSIS — R87612 Low grade squamous intraepithelial lesion on cytologic smear of cervix (LGSIL): Secondary | ICD-10-CM | POA: Diagnosis not present

## 2017-01-28 NOTE — Progress Notes (Signed)
Referring Provider:  [facility]  HPI:  Veronica Chan is a 31 y.o.  G0P0000  who presents today for evaluation and management of abnormal cervical cytology.    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  OB History  Gravida Para Term Preterm AB Living  0 0 0 0 0 0  SAB TAB Ectopic Multiple Live Births  0 0 0 0 0        Past Medical History:  Diagnosis Date  . Acne   . BRCA negative   . Chronic constipation   . Endometriosis   . Family history of breast cancer 03/2012   Pt's IBIS=34.2%10/15/  Pt's mom is MyRisk neg.  Marland Kitchen History of Papanicolaou smear of cervix 04/02/2013; 05/17/2014   neg; neg  . Mood swings ([place])   . Narcolepsy     Past Surgical History:  Procedure Laterality Date  . COLONOSCOPY WITH PROPOFOL N/A 04/23/2016   Procedure: COLONOSCOPY WITH PROPOFOL;  Surgeon: Lollie Sails, MD;  Location: [facility] ENDOSCOPY;  Service: Endoscopy;  Laterality: N/A;  . DIAGNOSTIC LAPAROSCOPY  2010   DR. SCHERMERHORN - ENDOMETRIOSIS  . ENDOMETRIAL BIOPSY      SOCIAL HISTORY: History  Alcohol Use  . Yes   History  Drug Use No     Family History  Problem Relation Age of Onset  . Breast cancer Mother 88       TAH/BSO BEFORE BR CA, IS MyRisk NEG  . Colon polyps Mother   . Thyroid cancer Mother   . Breast cancer Maternal Grandmother 20  . Cancer Paternal Grandfather        lung    ALLERGIES:  Patient has no known allergies.  Current Outpatient Prescriptions on File Prior to Visit  Medication Sig Dispense Refill  . amoxicillin-clavulanate (AUGMENTIN) 875-125 MG tablet Take 1 tablet by mouth 2 (two) times daily. (Patient not taking: Reported on 04/23/2016) 20 tablet 0  . budesonide (RHINOCORT ALLERGY) 32 MCG/ACT nasal spray Place 2 sprays into both nostrils daily.    Marland Kitchen leuprolide (LUPRON) 7.5 MG injection Inject 7.5 mg into the muscle once.    . lubiprostone (AMITIZA) 24 MCG capsule Take 24 mcg by mouth  2 (two) times daily with a meal.    . methylphenidate (RITALIN) 20 MG tablet Take 20 mg by mouth 2 (two) times daily.    . modafinil (PROVIGIL) 100 MG tablet Take 100 mg by mouth daily.    . Multiple Vitamin (MULTIVITAMIN) tablet Take 1 tablet by mouth daily.    . Probiotic Product (ALIGN PO) Take 1 tablet by mouth daily.     No current facility-administered medications on file prior to visit.     Physical Exam: -Vitals:  BP 100/60   Pulse (!) 59   Ht '5\' 5"'$  (1.651 m)   Wt 141 lb (64 kg)   LMP 12/29/2016   BMI 23.46 kg/m  GEN: WD, WN, NAD.  A+ O x 3, good mood and affect. ABD:  NT, ND.  Soft, no masses.  No hernias noted.   Pelvic:   Vulva: Normal appearance.  No lesions.  Vagina: No lesions or abnormalities noted.  Support: Normal pelvic support.  Urethra No masses tenderness or scarring.  Meatus Normal size without lesions or prolapse.  Cervix: See below.  Anus: Normal exam.  No lesions.  Perineum: Normal exam.  No lesions.        Bimanual  Uterus: Normal size.  Non-tender.  Mobile.  AV.  Adnexae: No masses.  Non-tender to palpation.  Cul-de-sac: Negative for abnormality.   PROCEDURE: 1.  Urine Pregnancy Test:  not done 2.  Colposcopy performed with 4% acetic acid after verbal consent obtained                                         -Aceto-white Lesions Location(s): none.              -Biopsy performed at 6,12 o'clock               -ECC indicated and performed: Yes.       -Biopsy sites made hemostatic with pressure, AgNO3, and/or Monsel's solution   -Satisfactory colposcopy: Yes.      -Evidence of Invasive cervical CA :  NO  ASSESSMENT:  Veronica Chan is a 31 y.o. G0P0000 here for  1. Low grade squamous intraepith lesion on cytologic smear cervix (lgsil)   .  PLAN: 1.  I discussed the grading system of pap smears and HPV high risk viral types.  We will discuss and base management after colpo results return. 2. Follow up PAP 6 months, vs intervention if high  grade dysplasia identified 3. Treatment of persistantly abnormal PAP smears and cervical dysplasia, even mild, is discussed w pt today in detail, as well as the pros and cons of Cryo and LEEP procedures. Will consider and discuss after results.      Barnett Applebaum, MD, [facility], [facility] 01/28/2017  3:19 PM

## 2017-01-28 NOTE — Patient Instructions (Signed)

## 2017-01-29 ENCOUNTER — Other Ambulatory Visit: Payer: Self-pay | Admitting: *Deleted

## 2017-01-29 ENCOUNTER — Inpatient Hospital Stay
Admission: RE | Admit: 2017-01-29 | Discharge: 2017-01-29 | Disposition: A | Discharge status: Home / Self Care | Payer: Self-pay | Source: Ambulatory Visit | Attending: *Deleted | Admitting: *Deleted

## 2017-01-29 DIAGNOSIS — Z9289 Personal history of other medical treatment: Secondary | ICD-10-CM

## 2017-01-30 LAB — PATHOLOGY

## 2017-02-07 NOTE — Progress Notes (Signed)
D/w pt; reassued, f/u PAP 6 mos

## 2017-02-19 ENCOUNTER — Other Ambulatory Visit: Payer: Self-pay | Admitting: Obstetrics and Gynecology

## 2017-02-19 ENCOUNTER — Ambulatory Visit
Admission: RE | Admit: 2017-02-19 | Discharge: 2017-02-19 | Disposition: A | Discharge status: Home / Self Care | Payer: 59 | Source: Ambulatory Visit | Attending: Obstetrics and Gynecology | Admitting: Obstetrics and Gynecology

## 2017-02-19 DIAGNOSIS — Z1231 Encounter for screening mammogram for malignant neoplasm of breast: Secondary | ICD-10-CM | POA: Diagnosis present

## 2017-02-19 DIAGNOSIS — Z803 Family history of malignant neoplasm of breast: Secondary | ICD-10-CM

## 2017-02-19 DIAGNOSIS — Z1239 Encounter for other screening for malignant neoplasm of breast: Secondary | ICD-10-CM

## 2017-02-21 ENCOUNTER — Encounter: Payer: Self-pay | Admitting: Obstetrics and Gynecology

## 2018-06-08 ENCOUNTER — Ambulatory Visit: Payer: 59 | Admitting: Obstetrics and Gynecology

## 2018-06-09 ENCOUNTER — Encounter: Payer: Self-pay | Admitting: Obstetrics and Gynecology

## 2018-06-09 ENCOUNTER — Other Ambulatory Visit (HOSPITAL_COMMUNITY)
Admission: RE | Admit: 2018-06-09 | Discharge: 2018-06-09 | Disposition: A | Discharge status: Home / Self Care | Payer: 59 | Source: Ambulatory Visit | Attending: Obstetrics and Gynecology | Admitting: Obstetrics and Gynecology

## 2018-06-09 ENCOUNTER — Ambulatory Visit (INDEPENDENT_AMBULATORY_CARE_PROVIDER_SITE_OTHER): Payer: 59 | Admitting: Obstetrics and Gynecology

## 2018-06-09 VITALS — BP 98/60 | HR 72 | Ht 65.0 in | Wt 137.0 lb

## 2018-06-09 DIAGNOSIS — Z113 Encounter for screening for infections with a predominantly sexual mode of transmission: Secondary | ICD-10-CM | POA: Insufficient documentation

## 2018-06-09 DIAGNOSIS — Z01419 Encounter for gynecological examination (general) (routine) without abnormal findings: Secondary | ICD-10-CM

## 2018-06-09 DIAGNOSIS — Z124 Encounter for screening for malignant neoplasm of cervix: Secondary | ICD-10-CM | POA: Insufficient documentation

## 2018-06-09 DIAGNOSIS — Z803 Family history of malignant neoplasm of breast: Secondary | ICD-10-CM

## 2018-06-09 DIAGNOSIS — Z1151 Encounter for screening for human papillomavirus (HPV): Secondary | ICD-10-CM | POA: Diagnosis present

## 2018-06-09 DIAGNOSIS — R87612 Low grade squamous intraepithelial lesion on cytologic smear of cervix (LGSIL): Secondary | ICD-10-CM | POA: Insufficient documentation

## 2018-06-09 DIAGNOSIS — Z1239 Encounter for other screening for malignant neoplasm of breast: Secondary | ICD-10-CM

## 2018-06-09 DIAGNOSIS — Z9189 Other specified personal risk factors, not elsewhere classified: Secondary | ICD-10-CM

## 2018-06-09 DIAGNOSIS — L659 Nonscarring hair loss, unspecified: Secondary | ICD-10-CM

## 2018-06-09 NOTE — Progress Notes (Signed)
Chief Complaint  Patient presents with  . Gynecologic Exam    for about year pt is having a lot of hair loss     HPI:      [name] is a 32 y.o. G0P0000 who LMP was Patient's last menstrual period was 05/31/2018 (exact date)., presents today for her annual examination.  Her menses are regular every 28-30 days, lasting 5 days.  Med flow. Dysmenorrhea moderate, occurring 2 weeks each month. She does not have intermenstrual bleeding. She has a hx of endometriosis (diagnosed on dx lap 2010 at [facility]) and has pressure/pain the week before and during her period. She did lupron for 6 months summer 2107. Sx improved with treatment. Pt doesn't like the way she feels on OCPs, so never started f/u med. Discussed pt seeing MD for pain last yr, but sx stable for now.  Sex activity: not sexually active but was earlier this yr. No dyspareunia. Last Pap: 12/24/16  Results were: LGSIL/pos HPV DNA; Colpo done 01/28/17 with Dr. 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 Repeat pap due after 6 months but not done. Will repeat today. Hx of STDs: HPV  Last mammogram: 02/20/17 Results were normal, repeat in 1 yr due to FH/increased risk of breast cancer.  There is a FH of breast cancer in her mom and MGM. There is no FH of ovarian cancer.   Pt is BRCA neg and her mom was My Risk neg. IBIS=34%. She has not had a scr breast MRI. She takes Vit D supp. The patient does do self-breast exams.  Tobacco use: The patient denies current or previous tobacco use. Alcohol use: none Exercise: moderately active  She does get adequate calcium and Vitamin D in her diet.  Has noted increased hair loss this yr without bald or thin spots. No FH female baldness. Had neg labs with PCP. Not taking MVI. Not under increased stress. Has side effects when takes biotin.  Past Medical History:  Diagnosis Date  . Acne   . BRCA negative   . Chronic constipation   . Endometriosis   . Family history of breast cancer 03/2012   Pt's IBIS=34.2%10/15/   Pt's mom is MyRisk neg.  Marland Kitchen History of Papanicolaou smear of cervix 04/02/2013; 05/17/2014   neg; neg  . Mood swings   . Narcolepsy     Past Surgical History:  Procedure Laterality Date  . AUGMENTATION MAMMAPLASTY Bilateral 2017  . COLONOSCOPY WITH PROPOFOL N/A 04/23/2016   Procedure: COLONOSCOPY WITH PROPOFOL;  Surgeon: Lollie Sails, MD;  Location: [facility] ENDOSCOPY;  Service: Endoscopy;  Laterality: N/A;  . DIAGNOSTIC LAPAROSCOPY  2010   DR. SCHERMERHORN - ENDOMETRIOSIS  . ENDOMETRIAL BIOPSY      Family History  Problem Relation Age of Onset  . Breast cancer Mother 81       TAH/BSO BEFORE BR CA, IS MyRisk NEG  . Colon polyps Mother   . Thyroid cancer Mother   . Breast cancer Maternal Grandmother 100  . Cancer Paternal Grandfather        lung    Social History   Socioeconomic History  . Marital status: Single    Spouse name: Not on file  . Number of children: Not on file  . Years of education: 40  . Highest education level: Not on file  Occupational History    Employer: [employer]  . Financial resource strain: Not on file  . Food insecurity:    Worry: Not on file  Inability: Not on file  . Transportation needs:    Medical: Not on file    Non-medical: Not on file  Tobacco Use  . Smoking status: Former Research scientist (life sciences)  . Smokeless tobacco: Former Network engineer and Sexual Activity  . Alcohol use: Yes    Comment: rarely  . Drug use: No  . Sexual activity: Not Currently    Birth control/protection: None  Lifestyle  . Physical activity:    Days per week: Not on file    Minutes per session: Not on file  . Stress: Not on file  Relationships  . Social connections:    Talks on phone: Not on file    Gets together: Not on file    Attends religious service: Not on file    Active member of club or organization: Not on file    Attends meetings of clubs or organizations: Not on file    Relationship status: Not on file  . Intimate partner violence:    Fear  of current or ex partner: Not on file    Emotionally abused: Not on file    Physically abused: Not on file    Forced sexual activity: Not on file  Other Topics Concern  . Not on file  Social History Narrative  . Not on file     Current Outpatient Medications:  .  Azelastine-Fluticasone (DYMISTA) 137-50 MCG/ACT SUSP, USE 1 SPRAY INTO EACH NOSTRIL TWICE A DAY, Disp: , Rfl:  .  budesonide (RHINOCORT ALLERGY) 32 MCG/ACT nasal spray, Place 2 sprays into both nostrils daily., Disp: , Rfl:  .  levocetirizine (XYZAL) 5 MG tablet, every evening., Disp: , Rfl: 5 .  montelukast (SINGULAIR) 10 MG tablet, Take 10 mg by mouth daily., Disp: , Rfl: 5  ROS:  Review of Systems  Constitutional: Negative for fatigue, fever and unexpected weight change.  Respiratory: Negative for cough, shortness of breath and wheezing.   Cardiovascular: Negative for chest pain, palpitations and leg swelling.  Gastrointestinal: Negative for blood in stool, constipation, diarrhea, nausea and vomiting.  Endocrine: Negative for cold intolerance, heat intolerance and polyuria.  Genitourinary: Negative for dyspareunia, dysuria, flank pain, frequency, genital sores, hematuria, menstrual problem, pelvic pain, urgency, vaginal bleeding, vaginal discharge and vaginal pain.  Musculoskeletal: Negative for back pain, joint swelling and myalgias.  Skin: Negative for rash.  Neurological: Negative for dizziness, syncope, light-headedness, numbness and headaches.  Hematological: Negative for adenopathy.  Psychiatric/Behavioral: Negative for agitation, confusion, sleep disturbance and suicidal ideas. The patient is not nervous/anxious.      Objective: BP 98/60   Pulse 72   Ht '5\' 5"'$  (1.651 m)   Wt 137 lb (62.1 kg)   LMP 05/31/2018 (Exact Date)   BMI 22.80 kg/m    Physical Exam  Constitutional: She is oriented to person, place, and time. She appears well-developed and well-nourished.  Genitourinary: Vagina normal and uterus  normal. There is no rash or tenderness on the right labia. There is no rash or tenderness on the left labia. No erythema or tenderness in the vagina. No vaginal discharge found. Right adnexum does not display mass and does not display tenderness. Left adnexum does not display mass and does not display tenderness. Cervix does not exhibit motion tenderness or polyp. Uterus is not enlarged or tender.  Neck: Normal range of motion. No thyromegaly present.  Cardiovascular: Normal rate, regular rhythm and normal heart sounds.  No murmur heard. Pulmonary/Chest: Effort normal and breath sounds normal. Right breast exhibits no mass, no nipple  discharge, no skin change and no tenderness. Left breast exhibits no mass, no nipple discharge, no skin change and no tenderness.  Abdominal: Soft. There is no tenderness. There is no guarding.  Musculoskeletal: Normal range of motion.  Neurological: She is alert and oriented to person, place, and time. No cranial nerve deficit.  Psychiatric: She has a normal mood and affect. Her behavior is normal.  Vitals reviewed.   Assessment/Plan: Encounter for annual routine gynecological examination  Cervical cancer screening - Plan: Cytology - PAP, CANCELED: Cytology - PAP  Screening for HPV (human papillomavirus) - Repeat pap today. Will call with resutls.  - Plan: Cytology - PAP, CANCELED: Cytology - PAP  Screening for STD (sexually transmitted disease) - Plan: Cytology - PAP  Low grade squamous intraepith lesion on cytologic smear cervix (lgsil) - Plan: Cytology - PAP  Screening for breast cancer - Pt to sched mammo. - Plan: MM 3D SCREEN BREAST BILATERAL  Family history of breast cancer - Plan: MM 3D SCREEN BREAST BILATERAL, MR BREAST BILATERAL W [place] CAD  Increased risk of breast cancer - Discussed monthly SBE, yearly mammos, CBE and scr breast MRI. Pt has met deductible this yr and interested in MRI. Must have mammo first. Cont Vit D - Plan: MM 3D  SCREEN BREAST BILATERAL, MR BREAST BILATERAL W WO CONTRAST INC CAD  Hair loss - No alopecia. Neg labs with PCP. Add MVI. F/u prn with derm.             GYN counsel breast self exam, mammography screening, adequate intake of calcium and vitamin D, diet and exercise     F/U  Return in about 1 year (around 06/10/2019).  Nazirah Tri B. Delanie Tirrell, PA-C 06/09/2018 4:07 PM

## 2018-06-09 NOTE — Patient Instructions (Signed)
I value your feedback and entrusting us with your care. If you get a [place] patient survey, I would appreciate you taking the time to let us know about your experience today. Thank you!  [facility]: [phone]    

## 2018-06-12 LAB — CYTOLOGY - PAP
Adequacy: ABSENT
Chlamydia: NEGATIVE
Diagnosis: NEGATIVE
HPV: DETECTED — AB
Neisseria Gonorrhea: NEGATIVE

## 2018-07-01 ENCOUNTER — Ambulatory Visit
Admission: RE | Admit: 2018-07-01 | Discharge: 2018-07-01 | Disposition: A | Discharge status: Home / Self Care | Payer: 59 | Source: Ambulatory Visit | Attending: Obstetrics and Gynecology | Admitting: Obstetrics and Gynecology

## 2018-07-01 ENCOUNTER — Encounter: Payer: Self-pay | Admitting: Obstetrics and Gynecology

## 2018-07-01 ENCOUNTER — Other Ambulatory Visit: Payer: Self-pay | Admitting: Obstetrics and Gynecology

## 2018-07-01 DIAGNOSIS — Z1239 Encounter for other screening for malignant neoplasm of breast: Secondary | ICD-10-CM | POA: Diagnosis present

## 2018-07-01 DIAGNOSIS — Z803 Family history of malignant neoplasm of breast: Secondary | ICD-10-CM | POA: Diagnosis present

## 2018-07-01 DIAGNOSIS — Z9189 Other specified personal risk factors, not elsewhere classified: Secondary | ICD-10-CM | POA: Insufficient documentation

## 2018-07-01 IMAGING — MG DIGITAL SCREENING BILATERAL MAMMOGRAM WITH IMPLANTS, CAD AND TOM
9 of 19 series · 9 of 39 positions shown · non-contrast
Comparison: Previous exam(s).

CLINICAL DATA: Screening.

EXAM:
DIGITAL SCREENING BILATERAL MAMMOGRAM WITH IMPLANTS, CAD AND TOMO
The patient has prepectoral implants. Standard and implant displaced
views were performed.

[L MLO]
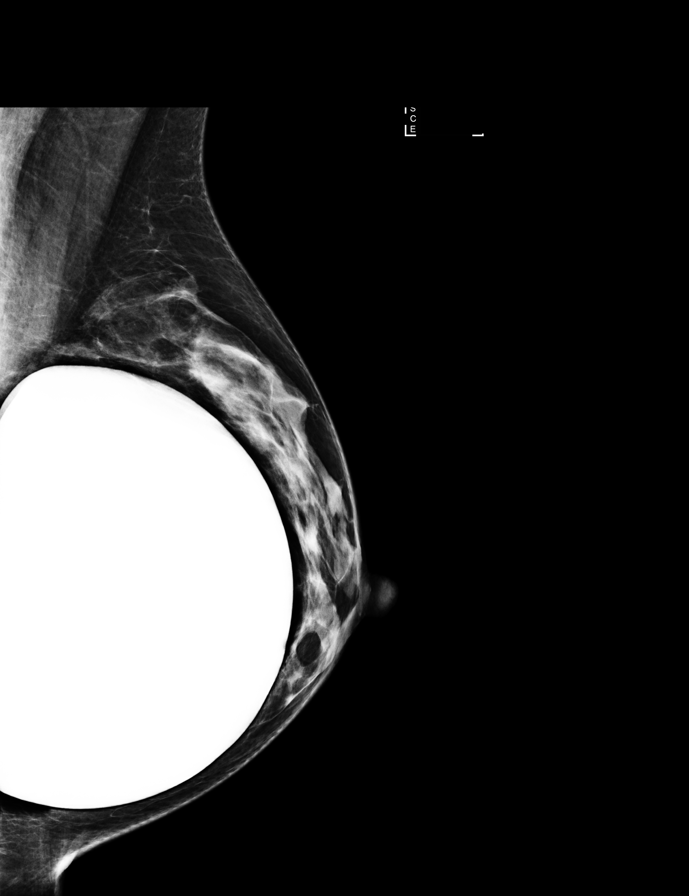

[R MLO (1 of 2)]
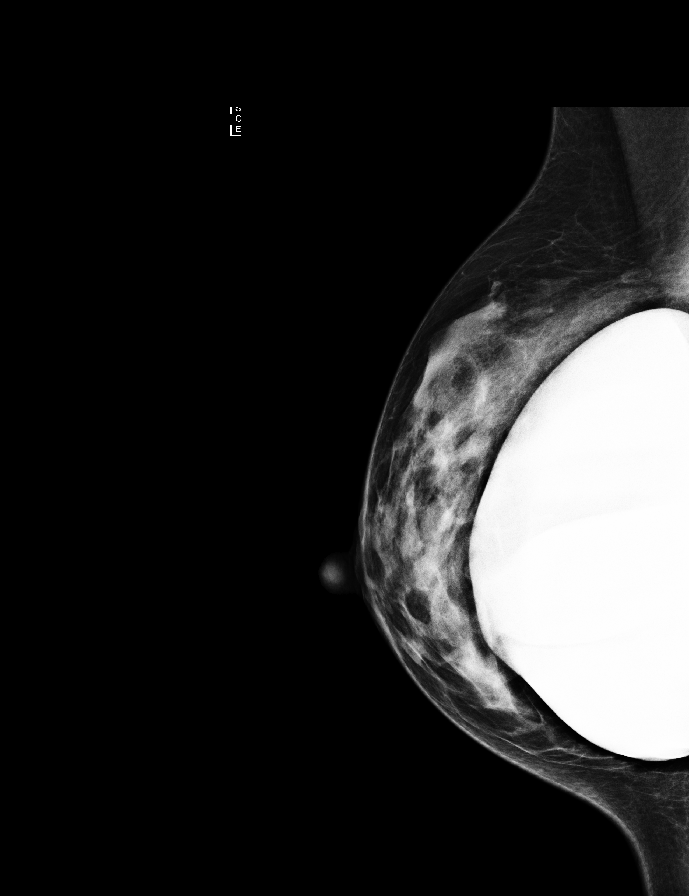

[R CC]
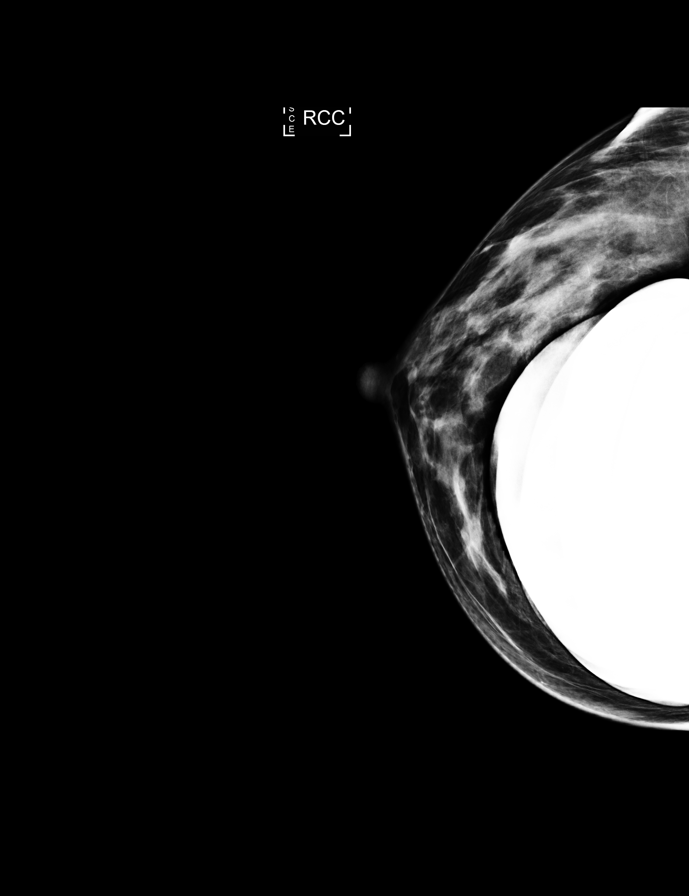

[L CC (1 of 2)]
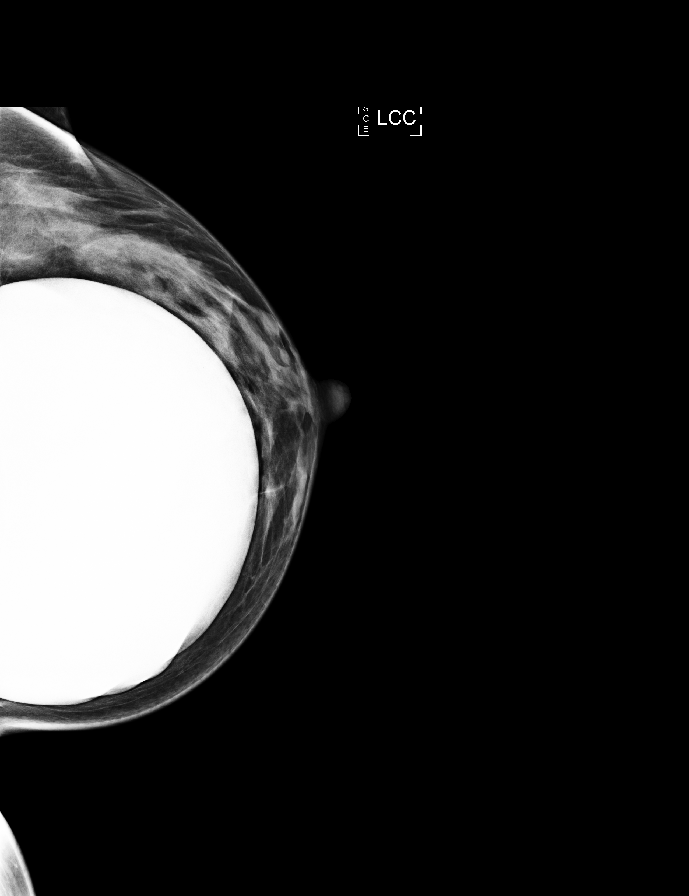

[L CC synth-2D]
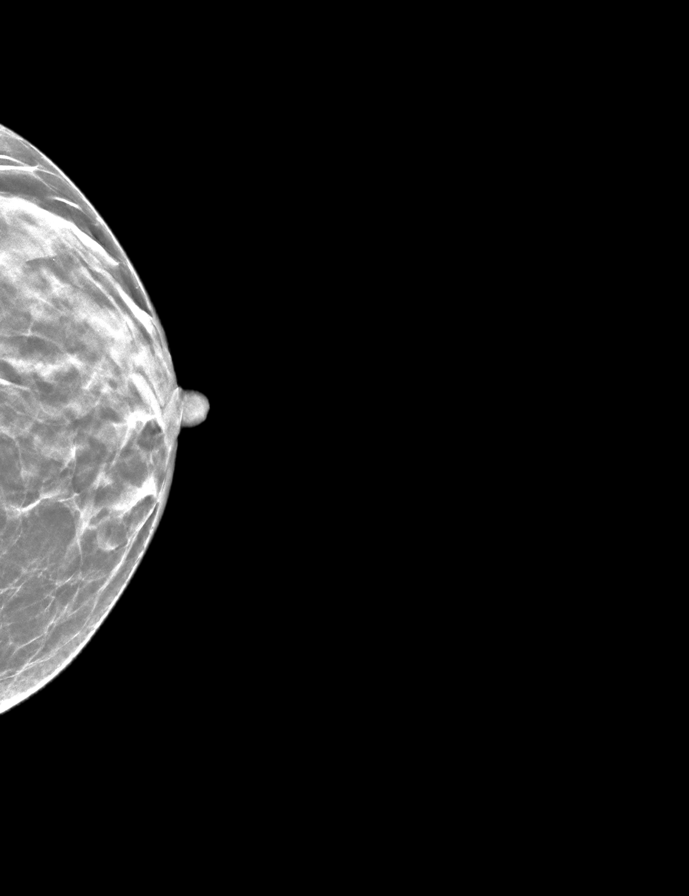

[L MLO synth-2D]
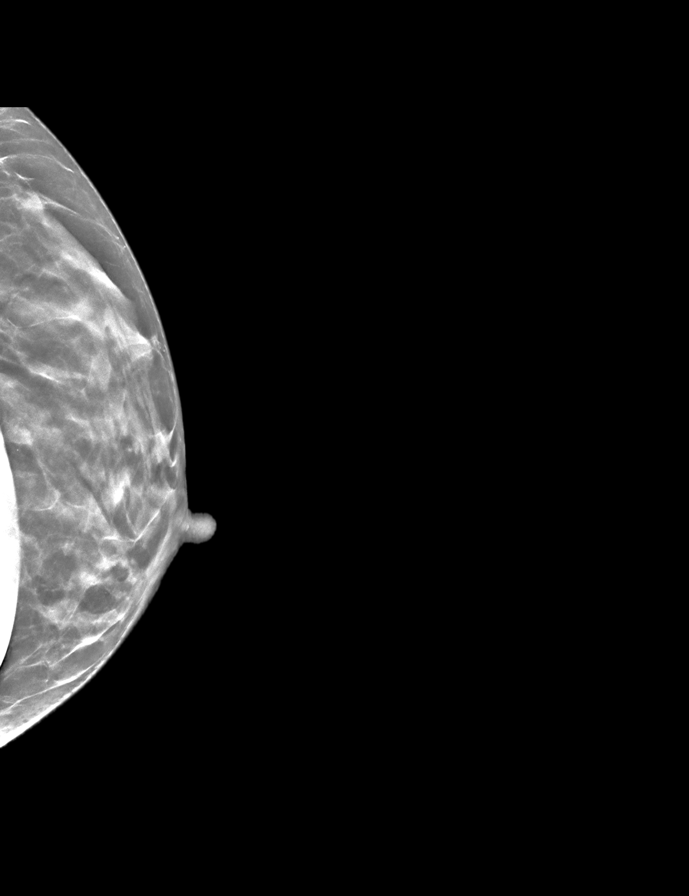

[L CC (2 of 2)]
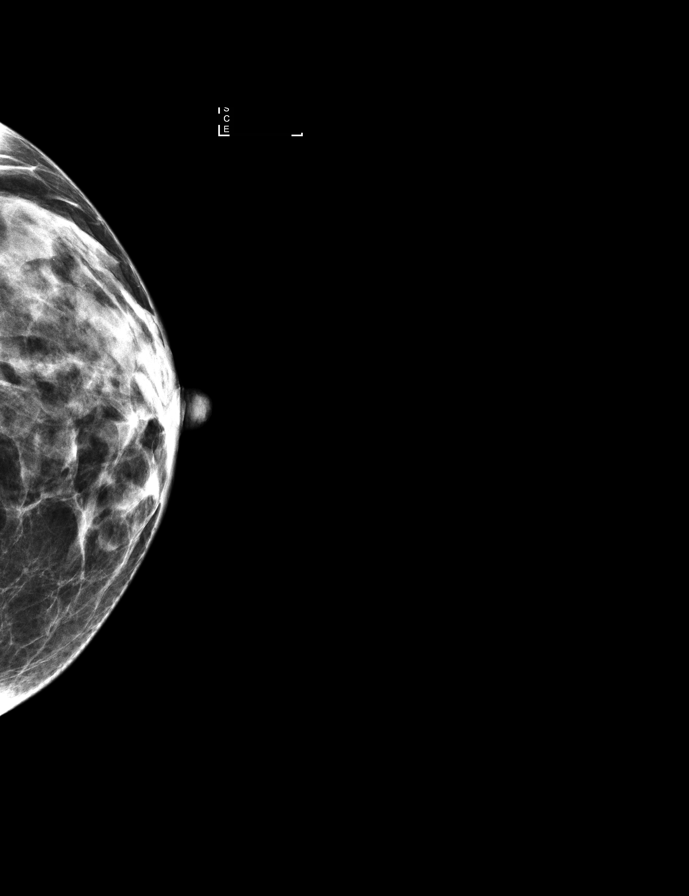

[R MLO (2 of 2)]
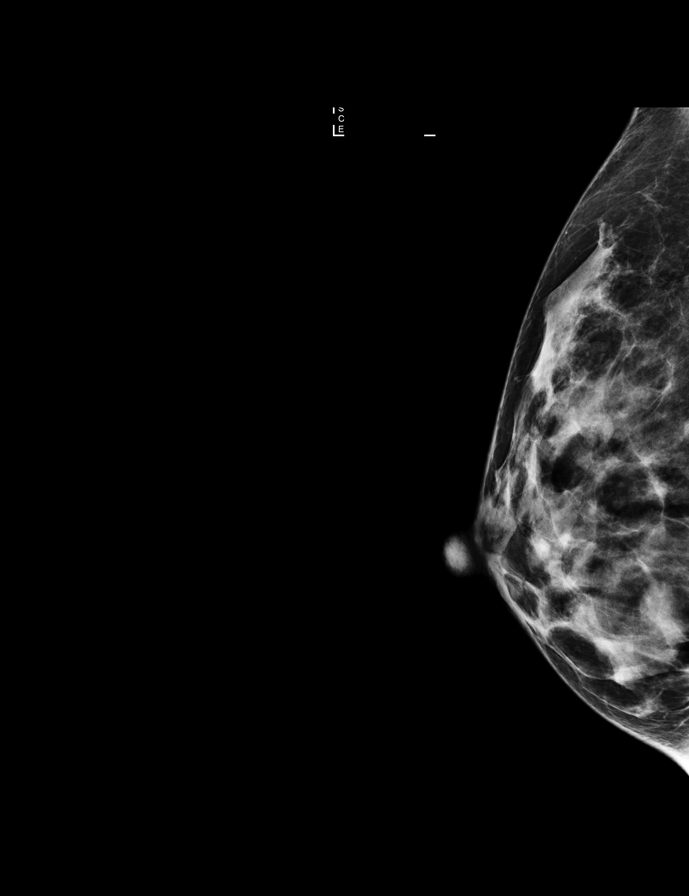

[R MLO synth-2D]
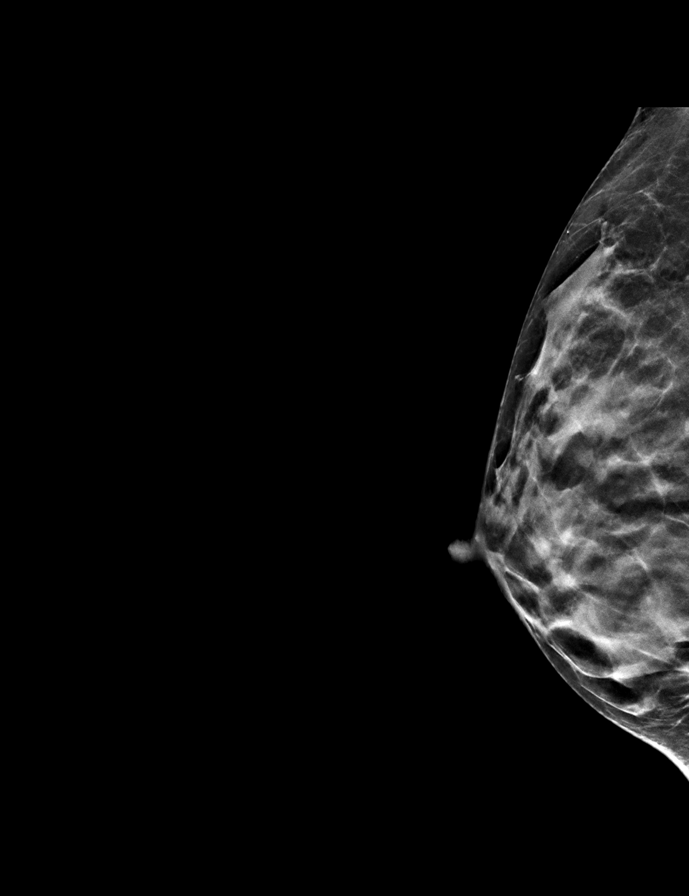

[9 of 39 positions shown; findings below may reference images not displayed]

ACR Breast Density Category c: The breast tissue is heterogeneously
dense, which may obscure small masses.
FINDINGS: There are no findings suspicious for malignancy. Images were
processed with CAD.
IMPRESSION: No mammographic evidence of malignancy. A result letter of this
screening mammogram will be mailed directly to the patient.

RECOMMENDATION:
Screening mammogram at age 39. (Code:[DB])

BI-RADS CATEGORY  1:  Negative.

## 2019-03-03 ENCOUNTER — Other Ambulatory Visit: Payer: Self-pay | Admitting: Obstetrics and Gynecology

## 2019-06-13 NOTE — Progress Notes (Addendum)
Chief Complaint  Patient presents with  . Gynecologic Exam     HPI:      [name] is a 33 y.o. G0P0000 who LMP was Patient's last menstrual period was 05/25/2019 (exact date)., presents today for her annual examination.  Her menses are regular every 28-30 days, lasting 5-6 days, mod flow. Dysmenorrhea moderate, occurring 2 weeks each month. She does not have intermenstrual bleeding. She has a hx of endometriosis (diagnosed on dx lap 2010 at [facility]) and has pressure/pain 2 weeks before and during her period. Takes midol with mostly relief. Has 2 really bad days with menses. Not missing work but able to work from home anyway and lies down. She did lupron for 6 months summer 2017. Sx improved with treatment. Pt doesn't like the way she feels on OCPs, so never started f/u med.  Sex activity: currently sexually active with occas dyspareunia. Using condoms. Declines BC for now.  Last Pap: 05/20/18  Results were no abnormalities/ POS HPV DNA. Repeat pap due this yr. 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 Hx of STDs: HPV  Last mammogram: 07/01/18  Results were normal, repeat in 1 yr due to FH/increased risk of breast cancer.  There is a FH of breast cancer in her mom and MGM. There is no FH of ovarian cancer.   Pt is BRCA neg and her mom was My Risk neg. Pt's IBIS=34%. She has not had a scr breast MRI but is interested this yr since has met deductible. She takes Vit D supp. The patient does do self-breast exams.  Tobacco use: The patient denies current or previous tobacco use. Alcohol use: rare No drug use Exercise: moderately active  She does get adequate calcium and Vitamin D in her diet.   Past Medical History:  Diagnosis Date  . Acne   . BRCA negative   . Chronic constipation   . Endometriosis   . Family history of breast cancer 03/2012   Pt's IBIS=34.2%10/15/  Pt's mom is MyRisk neg.  Marland Kitchen History of Papanicolaou smear of cervix  04/02/2013; 05/17/2014   neg; neg  . Mood swings   . Narcolepsy     Past Surgical History:  Procedure Laterality Date  . AUGMENTATION MAMMAPLASTY Bilateral 2017  . COLONOSCOPY WITH PROPOFOL N/A 04/23/2016   Procedure: COLONOSCOPY WITH PROPOFOL;  Surgeon: Lollie Sails, MD;  Location: [facility] ENDOSCOPY;  Service: Endoscopy;  Laterality: N/A;  . DIAGNOSTIC LAPAROSCOPY  2010   DR. SCHERMERHORN - ENDOMETRIOSIS  . ENDOMETRIAL BIOPSY      Family History  Problem Relation Age of Onset  . Breast cancer Mother 38       TAH/BSO BEFORE BR CA, IS MyRisk NEG  . Colon polyps Mother   . Thyroid cancer Mother   . Breast cancer Maternal Grandmother 2  . Cancer Paternal Grandfather        lung    Social History   Socioeconomic History  . Marital status: Single    Spouse name: Not on file  . Number of children: Not on file  . Years of education: 24  . Highest education level: Not on file  Occupational History    Employer: [employer]  . Financial resource strain: Not on file  . Food insecurity    Worry: Not on file    Inability: Not on file  . Transportation needs    Medical: Not on file  Non-medical: Not on file  Tobacco Use  . Smoking status: Former Research scientist (life sciences)  . Smokeless tobacco: Former Network engineer and Sexual Activity  . Alcohol use: Yes    Comment: rarely  . Drug use: No  . Sexual activity: Yes    Birth control/protection: None, Condom  Lifestyle  . Physical activity    Days per week: Not on file    Minutes per session: Not on file  . Stress: Not on file  Relationships  . Social Herbalist on phone: Not on file    Gets together: Not on file    Attends religious service: Not on file    Active member of club or organization: Not on file    Attends meetings of clubs or organizations: Not on file    Relationship status: Not on file  . Intimate partner violence    Fear of current or ex partner: Not on file    Emotionally abused: Not on file     Physically abused: Not on file    Forced sexual activity: Not on file  Other Topics Concern  . Not on file  Social History Narrative  . Not on file     Current Outpatient Medications:  .  Azelastine-Fluticasone (DYMISTA) 137-50 MCG/ACT SUSP, USE 1 SPRAY INTO EACH NOSTRIL TWICE A DAY, Disp: , Rfl:  .  budesonide (RHINOCORT ALLERGY) 32 MCG/ACT nasal spray, Place 2 sprays into both nostrils daily., Disp: , Rfl:  .  Cholecalciferol (VITAMIN D3) 125 MCG (5000 UT) TABS, Take by mouth., Disp: , Rfl:  .  levocetirizine (XYZAL) 5 MG tablet, every evening., Disp: , Rfl: 5 .  loratadine (CLARITIN) 10 MG tablet, Take by mouth., Disp: , Rfl:  .  montelukast (SINGULAIR) 10 MG tablet, Take 10 mg by mouth daily., Disp: , Rfl: 5 .  SUNOSI 75 MG TABS, , Disp: , Rfl:  .  Zinc 50 MG TABS, Take by mouth., Disp: , Rfl:   ROS:  Review of Systems  Constitutional: Negative for fatigue, fever and unexpected weight change.  Respiratory: Negative for cough, shortness of breath and wheezing.   Cardiovascular: Negative for chest pain, palpitations and leg swelling.  Gastrointestinal: Negative for blood in stool, constipation, diarrhea, nausea and vomiting.  Endocrine: Negative for cold intolerance, heat intolerance and polyuria.  Genitourinary: Positive for dyspareunia. Negative for dysuria, flank pain, frequency, genital sores, hematuria, menstrual problem, pelvic pain, urgency, vaginal bleeding, vaginal discharge and vaginal pain.  Musculoskeletal: Negative for back pain, joint swelling and myalgias.  Skin: Negative for rash.  Neurological: Negative for dizziness, syncope, light-headedness, numbness and headaches.  Hematological: Negative for adenopathy.  Psychiatric/Behavioral: Negative for agitation, confusion, sleep disturbance and suicidal ideas. The patient is not nervous/anxious.      Objective: BP 100/70   Ht '5\' 5"'$  (1.651 m)   Wt 128 lb (58.1 kg)   LMP 05/25/2019 (Exact Date)   BMI 21.30 kg/m     Physical Exam Constitutional:      Appearance: She is well-developed.  Genitourinary:     Vulva, vagina, uterus, right adnexa and left adnexa normal.     No vulval lesion or tenderness noted.     No vaginal discharge, erythema or tenderness.     No cervical motion tenderness or polyp.     Uterus is not enlarged or tender.     No right or left adnexal mass present.     Right adnexa not tender.     Left adnexa not  tender.  Neck:     Musculoskeletal: Normal range of motion.     Thyroid: No thyromegaly.  Cardiovascular:     Rate and Rhythm: Normal rate and regular rhythm.     Heart sounds: Normal heart sounds. No murmur.  Pulmonary:     Effort: Pulmonary effort is normal.     Breath sounds: Normal breath sounds.  Chest:     Breasts:        Right: No mass, nipple discharge, skin change or tenderness.        Left: No mass, nipple discharge, skin change or tenderness.  Abdominal:     Palpations: Abdomen is soft.     Tenderness: There is no abdominal tenderness. There is no guarding.  Musculoskeletal: Normal range of motion.  Neurological:     General: No focal deficit present.     Mental Status: She is alert and oriented to person, place, and time.     Cranial Nerves: No cranial nerve deficit.  Skin:    General: Skin is warm and dry.  Psychiatric:        Mood and Affect: Mood normal.        Behavior: Behavior normal.        Thought Content: Thought content normal.        Judgment: Judgment normal.  Vitals signs reviewed.     Assessment/Plan: Encounter for annual routine gynecological examination  Cervical cancer screening - Plan: [facility] PAP  Screening for HPV (human papillomavirus) - Plan: [facility] PAP  Low grade squamous intraepith lesion on cytologic smear cervix (lgsil) - Plan: [facility] PAP; Repeat pap today. Will call with results.   Encounter for screening mammogram for malignant neoplasm of breast - Plan: 3D MAMMOGRAM SCREENING BILATERAL; pt to sched mammo.   Family  history of breast cancer - Plan: 3D MAMMOGRAM SCREENING BILATERAL  Increased risk of breast cancer - Plan: 3D MAMMOGRAM SCREENING BILATERAL; Pt doing monthly SBE, yearly CBE, yearly mammos. Interested in scr breast MRI since met 2020 deductible. Will sched mammo first and then sched MRI before end of yr. Cont Vit D supp.   Encounter for breast cancer screening using non-mammogram modality - Plan: SCR BREAST MRI W WO CONTRAST             GYN counsel breast self exam, mammography screening, adequate intake of calcium and vitamin D, diet and exercise     F/U  Return in about 1 year (around 06/13/2020).  Donyae Kilner B. Donathan Buller, PA-C 06/14/2019 11:46 AM

## 2019-06-14 ENCOUNTER — Ambulatory Visit (INDEPENDENT_AMBULATORY_CARE_PROVIDER_SITE_OTHER): Payer: 59 | Admitting: Obstetrics and Gynecology

## 2019-06-14 ENCOUNTER — Other Ambulatory Visit: Payer: Self-pay

## 2019-06-14 ENCOUNTER — Other Ambulatory Visit (HOSPITAL_COMMUNITY)
Admission: RE | Admit: 2019-06-14 | Discharge: 2019-06-14 | Disposition: A | Discharge status: Home / Self Care | Payer: 59 | Source: Ambulatory Visit | Attending: Obstetrics and Gynecology | Admitting: Obstetrics and Gynecology

## 2019-06-14 ENCOUNTER — Encounter: Payer: Self-pay | Admitting: Obstetrics and Gynecology

## 2019-06-14 VITALS — BP 100/70 | Ht 65.0 in | Wt 128.0 lb

## 2019-06-14 DIAGNOSIS — Z1151 Encounter for screening for human papillomavirus (HPV): Secondary | ICD-10-CM | POA: Diagnosis present

## 2019-06-14 DIAGNOSIS — Z1231 Encounter for screening mammogram for malignant neoplasm of breast: Secondary | ICD-10-CM

## 2019-06-14 DIAGNOSIS — R87612 Low grade squamous intraepithelial lesion on cytologic smear of cervix (LGSIL): Secondary | ICD-10-CM | POA: Insufficient documentation

## 2019-06-14 DIAGNOSIS — Z124 Encounter for screening for malignant neoplasm of cervix: Secondary | ICD-10-CM | POA: Insufficient documentation

## 2019-06-14 DIAGNOSIS — R87622 Low grade squamous intraepithelial lesion on cytologic smear of vagina (LGSIL): Secondary | ICD-10-CM | POA: Diagnosis present

## 2019-06-14 DIAGNOSIS — Z1239 Encounter for other screening for malignant neoplasm of breast: Secondary | ICD-10-CM

## 2019-06-14 DIAGNOSIS — Z01419 Encounter for gynecological examination (general) (routine) without abnormal findings: Secondary | ICD-10-CM | POA: Diagnosis not present

## 2019-06-14 DIAGNOSIS — Z803 Family history of malignant neoplasm of breast: Secondary | ICD-10-CM

## 2019-06-14 DIAGNOSIS — Z9189 Other specified personal risk factors, not elsewhere classified: Secondary | ICD-10-CM

## 2019-06-14 NOTE — Patient Instructions (Signed)
I value your feedback and entrusting us with your care. If you get a [facility] patient survey, I would appreciate you taking the time to let us know about your experience today. Thank you!  [facility]: [phone]    

## 2019-06-14 NOTE — Addendum Note (Signed)
Addended by: Ardeth Perfect B on: 23/34/3568 11:47 AM   Modules accepted: Orders

## 2019-06-15 LAB — CYTOLOGY - PAP
Adequacy: ABSENT
Comment: NEGATIVE
Diagnosis: NEGATIVE
High risk HPV: POSITIVE — AB

## 2019-06-16 ENCOUNTER — Telehealth: Payer: Self-pay | Admitting: Obstetrics and Gynecology

## 2019-06-16 NOTE — Telephone Encounter (Signed)
Pt called returning your call 

## 2019-07-06 ENCOUNTER — Ambulatory Visit
Admission: RE | Admit: 2019-07-06 | Discharge: 2019-07-06 | Disposition: A | Discharge status: Home / Self Care | Payer: 59 | Source: Ambulatory Visit | Attending: Obstetrics and Gynecology | Admitting: Obstetrics and Gynecology

## 2019-07-06 DIAGNOSIS — Z1231 Encounter for screening mammogram for malignant neoplasm of breast: Secondary | ICD-10-CM | POA: Insufficient documentation

## 2019-07-06 DIAGNOSIS — Z9189 Other specified personal risk factors, not elsewhere classified: Secondary | ICD-10-CM | POA: Diagnosis present

## 2019-07-06 DIAGNOSIS — Z803 Family history of malignant neoplasm of breast: Secondary | ICD-10-CM | POA: Insufficient documentation

## 2019-07-07 ENCOUNTER — Encounter: Payer: Self-pay | Admitting: Obstetrics and Gynecology

## 2019-07-22 ENCOUNTER — Other Ambulatory Visit
Admission: RE | Admit: 2019-07-22 | Discharge: 2019-07-22 | Disposition: A | Discharge status: Home / Self Care | Payer: 59 | Source: Ambulatory Visit | Attending: Podiatry | Admitting: Podiatry

## 2019-07-22 DIAGNOSIS — M67372 Transient synovitis, left ankle and foot: Secondary | ICD-10-CM | POA: Diagnosis not present

## 2019-07-22 LAB — SYNOVIAL CELL COUNT + DIFF, W/ CRYSTALS
Crystals, Fluid: NONE SEEN
Eosinophils-Synovial: 0 %
Lymphocytes-Synovial Fld: 10 %
Monocyte-Macrophage-Synovial Fluid: 85 %
Neutrophil, Synovial: 5 %
WBC, Synovial: 107 /mm3 (ref 0–200)

## 2019-07-23 ENCOUNTER — Encounter: Payer: Self-pay | Admitting: Obstetrics & Gynecology

## 2019-07-23 ENCOUNTER — Ambulatory Visit (INDEPENDENT_AMBULATORY_CARE_PROVIDER_SITE_OTHER): Payer: 59 | Admitting: Obstetrics & Gynecology

## 2019-07-23 ENCOUNTER — Other Ambulatory Visit (HOSPITAL_COMMUNITY)
Admission: RE | Admit: 2019-07-23 | Discharge: 2019-07-23 | Disposition: A | Discharge status: Home / Self Care | Payer: 59 | Source: Ambulatory Visit | Attending: Obstetrics & Gynecology | Admitting: Obstetrics & Gynecology

## 2019-07-23 ENCOUNTER — Other Ambulatory Visit: Payer: Self-pay

## 2019-07-23 VITALS — BP 110/60 | Ht 65.0 in | Wt 126.0 lb

## 2019-07-23 DIAGNOSIS — N870 Mild cervical dysplasia: Secondary | ICD-10-CM | POA: Insufficient documentation

## 2019-07-23 DIAGNOSIS — B977 Papillomavirus as the cause of diseases classified elsewhere: Secondary | ICD-10-CM | POA: Diagnosis not present

## 2019-07-23 NOTE — Progress Notes (Signed)
HPI:  Veronica Chan is a 34 y.o.  G0P0000  who presents today for evaluation and management of abnormal cervical cytology.    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  OB History  Gravida Para Term Preterm AB Living  0 0 0 0 0 0  SAB TAB Ectopic Multiple Live Births  0 0 0 0 0    Past Medical History:  Diagnosis Date  . Acne   . BRCA negative   . Chronic constipation   . Endometriosis   . Family history of breast cancer 03/2012   Pt's IBIS=34.2%10/15/  Pt's mom is MyRisk neg.  Marland Kitchen History of Papanicolaou smear of cervix 04/02/2013; 05/17/2014   neg; neg  . Mood swings   . Narcolepsy     Past Surgical History:  Procedure Laterality Date  . AUGMENTATION MAMMAPLASTY Bilateral 2017  . COLONOSCOPY WITH PROPOFOL N/A 04/23/2016   Procedure: COLONOSCOPY WITH PROPOFOL;  Surgeon: Lollie Sails, MD;  Location: [facility] ENDOSCOPY;  Service: Endoscopy;  Laterality: N/A;  . DIAGNOSTIC LAPAROSCOPY  2010   DR. SCHERMERHORN - ENDOMETRIOSIS  . ENDOMETRIAL BIOPSY      SOCIAL HISTORY: Social History   Substance and Sexual Activity  Alcohol Use Yes   Comment: rarely   Social History   Substance and Sexual Activity  Drug Use No     Family History  Problem Relation Age of Onset  . Breast cancer Mother 34       TAH/BSO BEFORE BR CA, IS MyRisk NEG  . Colon polyps Mother   . Thyroid cancer Mother   . Breast cancer Maternal Grandmother 49  . Cancer Paternal Grandfather        lung    ALLERGIES:  Patient has no known allergies.  Current Outpatient Medications on File Prior to Visit  Medication Sig Dispense Refill  . Azelastine-Fluticasone (DYMISTA) 137-50 MCG/ACT SUSP USE 1 SPRAY INTO EACH NOSTRIL TWICE A DAY    . budesonide (RHINOCORT ALLERGY) 32 MCG/ACT nasal spray Place 2 sprays into both nostrils daily.    . Cholecalciferol (VITAMIN D3) 125 MCG (5000 UT) TABS Take by mouth.    . levocetirizine (XYZAL)  5 MG tablet every evening.  5  . loratadine (CLARITIN) 10 MG tablet Take by mouth.    . montelukast (SINGULAIR) 10 MG tablet Take 10 mg by mouth daily.  5  . SUNOSI 75 MG TABS     . Zinc 50 MG TABS Take by mouth.     No current facility-administered medications on file prior to visit.    Physical Exam: -Vitals:  BP 110/60   Ht _0  (1.651 m)   Wt 126 lb (57.2 kg)   LMP 07/13/2019   BMI 20.97 kg/m  GEN: WD, WN, NAD.  A+ O x 3, good mood and affect. ABD:  NT, ND.  Soft, no masses.  No hernias noted.   Pelvic:   Vulva: Normal appearance.  No lesions.  Vagina: No lesions or abnormalities noted.  Support: Normal pelvic support.  Urethra No masses tenderness or scarring.  Meatus Normal size without lesions or prolapse.  Cervix: See below.  Anus: Normal exam.  No lesions.  Perineum: Normal exam.  No lesions.        Bimanual   Uterus: Normal size.  Non-tender.  Mobile.  AV.  Adnexae: No masses.  Non-tender to palpation.  Cul-de-sac: Negative for abnormality.   PROCEDURE:  1.  Urine Pregnancy Test:  not done 2.  Colposcopy performed with 4% acetic acid after verbal consent obtained                                         -Aceto-white Lesions Location(s): 3 o'clock.              -Biopsy performed at 3 o'clock               -ECC indicated and performed: Yes.       -Biopsy sites made hemostatic with pressure, AgNO3, and/or Monsel's solution   -Satisfactory colposcopy: Yes.      -Evidence of Invasive cervical CA :  NO  ASSESSMENT:  Veronica Chan is a 34 y.o. G0P0000 here for  1. HPV in female   2. CIN I (cervical intraepithelial neoplasia I)   .  PLAN: 1.  I discussed the grading system of pap smears and HPV high risk viral types.  We will discuss and base management after colpo results return. 2. Follow up PAP 6 months, vs intervention if high grade dysplasia identified 3. Treatment of persistantly abnormal PAP smears and cervical dysplasia, even mild, is discussed w pt  today in detail, as well as the pros and cons of Cryo and LEEP procedures. Will consider and discuss after results.      Barnett Applebaum, MD, [facility], [facility] 07/23/2019  3:33 PM

## 2019-07-23 NOTE — Patient Instructions (Signed)

## 2019-07-27 ENCOUNTER — Telehealth: Payer: Self-pay | Admitting: Obstetrics & Gynecology

## 2019-07-27 LAB — SURGICAL PATHOLOGY

## 2019-07-27 NOTE — Progress Notes (Signed)
Sch F/U PAP visit in July2021  D/w pt-  Colposcopy and biopsies reveal low grade abnormality, which usually carries a 90% rate of resolving on its own.   As discussed, we will monitor this Low Grade Dysplasia by following PAP smears every 6 months until a normal trend develops, and if not may consider treatment in the future.

## 2019-07-27 NOTE — Telephone Encounter (Signed)
Called and left voicemail for patient to call back to confirm schedule follow up 6 mos pap for Monday, 01/24/20 at 3 pm with [facility]

## 2019-07-27 NOTE — Telephone Encounter (Signed)
-----   Message from Nadara Mustard, MD sent at 07/27/2019 10:42 AM EST ----- Sch F/U PAP visit in July2021  D/w pt-  Colposcopy and biopsies reveal low grade abnormality, which usually carries a 90% rate of resolving on its own.   As discussed, we will monitor this Low Grade Dysplasia by following PAP smears every 6 months until a normal trend develops, and if not may consider treatment in the future.

## 2020-01-24 ENCOUNTER — Ambulatory Visit: Payer: 59 | Admitting: Obstetrics & Gynecology

## 2020-01-26 ENCOUNTER — Ambulatory Visit (INDEPENDENT_AMBULATORY_CARE_PROVIDER_SITE_OTHER): Payer: No Typology Code available for payment source | Admitting: Obstetrics & Gynecology

## 2020-01-26 ENCOUNTER — Other Ambulatory Visit (HOSPITAL_COMMUNITY)
Admission: RE | Admit: 2020-01-26 | Discharge: 2020-01-26 | Disposition: A | Discharge status: Home / Self Care | Payer: No Typology Code available for payment source | Source: Ambulatory Visit | Attending: Obstetrics & Gynecology | Admitting: Obstetrics & Gynecology

## 2020-01-26 ENCOUNTER — Other Ambulatory Visit: Payer: Self-pay

## 2020-01-26 ENCOUNTER — Encounter: Payer: Self-pay | Admitting: Obstetrics & Gynecology

## 2020-01-26 VITALS — BP 100/60 | Ht 65.0 in | Wt 127.0 lb

## 2020-01-26 DIAGNOSIS — N870 Mild cervical dysplasia: Secondary | ICD-10-CM

## 2020-01-26 DIAGNOSIS — Z803 Family history of malignant neoplasm of breast: Secondary | ICD-10-CM | POA: Diagnosis not present

## 2020-01-26 DIAGNOSIS — Z9189 Other specified personal risk factors, not elsewhere classified: Secondary | ICD-10-CM | POA: Diagnosis not present

## 2020-01-26 NOTE — Progress Notes (Signed)
HPI:  Patient is a 34 y.o. G0P0000 presenting for follow up evaluation of abnormal PAP smear in the past.  Her last PAP was 7 months ago and was abnormal: HPV (econd occurance). She has had a prior colposcopy. Prior biopsies (if done) were CIN I (in Jan 2021).  PMHx: She  has a past medical history of Acne, BRCA negative, Chronic constipation, Endometriosis, Family history of breast cancer (03/2012), History of Papanicolaou smear of cervix (04/02/2013; 05/17/2014), Mood swings, and Narcolepsy. Also,  has a past surgical history that includes Diagnostic laparoscopy (2010); Colonoscopy with propofol (N/A, 04/23/2016); Endometrial biopsy; and Augmentation mammaplasty (Bilateral, 2017)., family history includes Breast cancer (age of onset: 88) in her mother; Breast cancer (age of onset: 20) in her maternal grandmother; Cancer in her paternal grandfather; Colon polyps in her mother; Thyroid cancer in her mother.,  reports that she has quit smoking. She has quit using smokeless tobacco. She reports current alcohol use. She reports that she does not use drugs.  She has a current medication list which includes the following prescription(s): azelastine-fluticasone, budesonide, vitamin d3, levocetirizine, loratadine, montelukast, sunosi, and zinc. Also, has No Known Allergies.  Review of Systems  All other systems reviewed and are negative.   Objective: BP 100/60    Ht '5\' 5"'$  (1.651 m)    Wt 127 lb (57.6 kg)    LMP 01/09/2020    BMI 21.13 kg/m  Filed Weights   01/26/20 1507  Weight: 127 lb (57.6 kg)   Body mass index is 21.13 kg/m.  Physical examination Physical Exam Constitutional:      General: She is not in acute distress.    Appearance: She is well-developed.  Genitourinary:     Pelvic exam was performed with patient supine.     Vagina and uterus normal.     No vaginal erythema or bleeding.     No cervical motion tenderness, discharge, polyp or nabothian cyst.     Uterus is mobile.      Uterus is not enlarged.     No uterine mass detected.    Uterus is midaxial.     No right or left adnexal mass present.     Right adnexa not tender.     Left adnexa not tender.  HENT:     Head: Normocephalic and atraumatic.     Nose: Nose normal.  Abdominal:     General: There is no distension.     Palpations: Abdomen is soft.     Tenderness: There is no abdominal tenderness.  Musculoskeletal:        General: Normal range of motion.  Neurological:     Mental Status: She is alert and oriented to person, place, and time.     Cranial Nerves: No cranial nerve deficit.  Skin:    General: Skin is warm and dry.  Psychiatric:        Attention and Perception: Attention normal.        Mood and Affect: Mood and affect normal.        Speech: Speech normal.        Behavior: Behavior normal.        Thought Content: Thought content normal.        Judgment: Judgment normal.     ASSESSMENT:  History of Cervical Dysplasia  Plan:  1.  I discussed the grading system of pap smears and HPV high risk viral types.   2. Follow up PAP 6 months, vs intervention if high grade dysplasia  identified. 3. Treatment of persistantly abnormal PAP smears and cervical dysplasia, even mild, is discussed w pt today in detail, as well as the pros and cons of Cryo and LEEP procedures. Will consider and discuss after results.  Also to schedule Breast MRI due to high risk factors. Annual in 6 mos  A total of 20 minutes were spent face-to-face with the patient as well as preparation, review, communication, and documentation during this encounter.    Barnett Applebaum, MD, [facility], [facility] 01/26/2020  3:38 PM

## 2020-01-26 NOTE — Patient Instructions (Signed)
Thank you for choosing [facility]. As part of our ongoing efforts to improve patient experience, we would appreciate your feedback. Please fill out the short survey that you will receive by mail or MyChart. Your opinion is important to us! -Dr Levaughn Puccinelli  

## 2020-02-01 LAB — CYTOLOGY - PAP
Diagnosis: NEGATIVE
Diagnosis: REACTIVE

## 2020-02-10 ENCOUNTER — Telehealth: Payer: Self-pay | Admitting: Obstetrics & Gynecology

## 2020-02-10 NOTE — Telephone Encounter (Signed)
Left message for patient to call office back in regards to appointment needing to be set up at [facility].

## 2020-03-07 ENCOUNTER — Encounter: Payer: Self-pay | Admitting: Obstetrics and Gynecology

## 2020-03-07 ENCOUNTER — Other Ambulatory Visit: Payer: Self-pay

## 2020-03-07 ENCOUNTER — Ambulatory Visit
Admission: RE | Admit: 2020-03-07 | Discharge: 2020-03-07 | Disposition: A | Discharge status: Home / Self Care | Payer: No Typology Code available for payment source | Source: Ambulatory Visit | Attending: Obstetrics & Gynecology | Admitting: Obstetrics & Gynecology

## 2020-03-07 DIAGNOSIS — Z803 Family history of malignant neoplasm of breast: Secondary | ICD-10-CM

## 2020-03-07 DIAGNOSIS — Z1239 Encounter for other screening for malignant neoplasm of breast: Secondary | ICD-10-CM | POA: Insufficient documentation

## 2020-03-07 DIAGNOSIS — Z9189 Other specified personal risk factors, not elsewhere classified: Secondary | ICD-10-CM | POA: Diagnosis not present

## 2020-03-07 IMAGING — MR MR BREAST BILAT WO/W CM
2 of 9 series · 6 of 48 positions shown · IV contrast (5ml Gadavist)
Comparison: Screening mammogram on [DATE]

CLINICAL DATA: High risk breast cancer screening study. Patient has
strong maternal family history of breast cancer.

LABS:  None obtained at the time of imaging.
EXAM:
BILATERAL BREAST MRI WITH AND WITHOUT CONTRAST
TECHNIQUE: Multiplanar, multisequence MR images of both breasts were obtained
prior to and following the intravenous administration of 5 ml of
Gadavist

[Series 2: T1 · axial · B · 1.5mm · 1.02mm/px · z∈[-84,+82]mm · 5 of 112 slices shown]
[im 1/112]
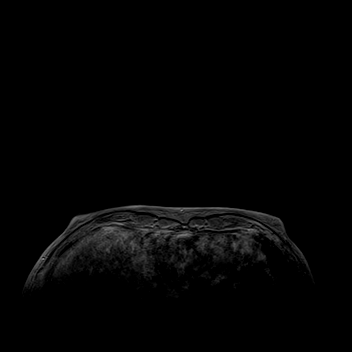
[im 28/112]
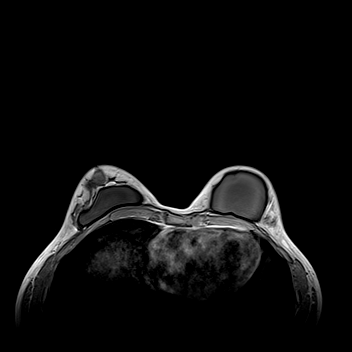
[im 56/112]
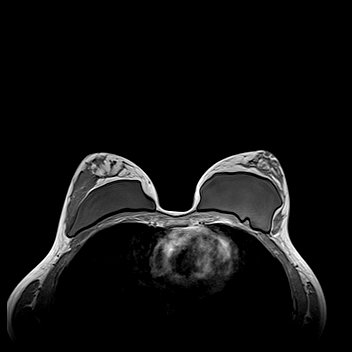
[im 84/112]
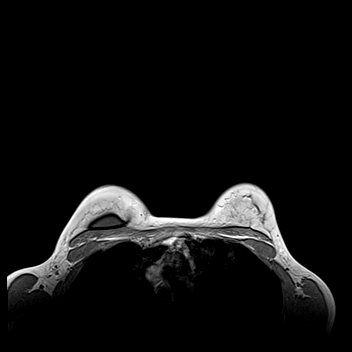
[im 112/112]
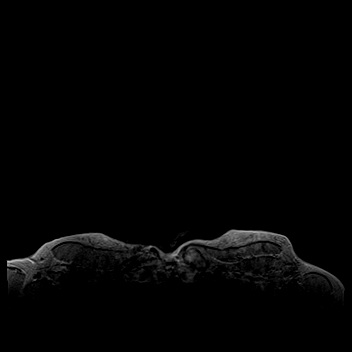

[Series 3: T2 · axial · B · 3.0mm · 1.02mm/px · 1 of 46 slices shown]
[im 1/46]
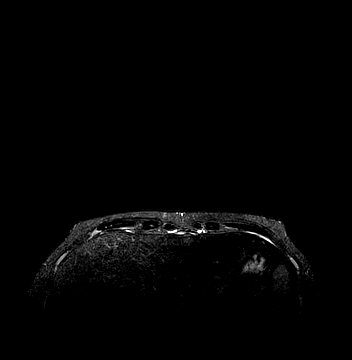

[6 of 48 positions shown; findings below may reference images not displayed]

Three-dimensional MR images were rendered by post-processing of the
original MR data on an independent workstation. The
three-dimensional MR images were interpreted, and findings are
reported in the following complete MRI report for this study. Three
dimensional images were evaluated at the independent interpreting
workstation using the DynaCAD thin client.
FINDINGS: Breast composition: c. Heterogeneous fibroglandular tissue.

Background parenchymal enhancement: Minimal

Right breast: No mass or abnormal enhancement. Patient has
retroglandular silicone implant.

Left breast: No mass or abnormal enhancement. Patient has
retroglandular silicone implant.

Lymph nodes: No abnormal appearing lymph nodes.

Ancillary findings:  None.
IMPRESSION: No MRI evidence for malignancy.

RECOMMENDATION:
Recommend screening mammogram in [DATE]

Based on the recommendations of the American Cancer Society, annual
screening MRI is suggested in addition to annual mammography if the
patient has an estimated lifetime risk of developing breast cancer
which is greater than 20%.

BI-RADS CATEGORY  1: Negative.

## 2020-03-07 MED ORDER — GADOBUTROL 1 MMOL/ML IV SOLN
5.0000 mL | Freq: Once | INTRAVENOUS | Status: AC | PRN
  Administered 2020-03-07: 5 mL via INTRAVENOUS

## 2020-03-22 ENCOUNTER — Ambulatory Visit: Payer: No Typology Code available for payment source | Admitting: Obstetrics and Gynecology

## 2020-03-27 NOTE — Progress Notes (Signed)
[name], MD   Chief Complaint  Patient presents with  . Follow-up    pms sx-mood,anger, no energy before cycle starts    HPI:      [name] is a 34 y.o. G0P0000 whose LMP was Patient's last menstrual period was 03/20/2020 (exact date)., presents today for PMS sx of mood changes, irritability, fatigue, depression and anxiety about 8 days before menses Qmonth. Doesn't feel well on hormones so has declined BC in past. Would like to know other tx options. Menses are still monthly.   Also has been feeling fatigued and overwhelmed with stressors. Did OTC lab work for metabolism which showed low free testosterone and low cortisol. Labs were fasting and first thing in AM. Pt has been exercising when she can, but has trouble with sleeping. 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 Takes meds for daytime fatigue but would like to sleep better at night.     Past Medical History:  Diagnosis Date  . Acne   . BRCA negative   . Chronic constipation   . Endometriosis   . Family history of breast cancer 03/2012   Pt's IBIS=34.2%10/15/  Pt's mom is MyRisk neg.  Marland Kitchen History of Papanicolaou smear of cervix 04/02/2013; 05/17/2014   neg; neg  . Mood swings   . Narcolepsy     Past Surgical History:  Procedure Laterality Date  . AUGMENTATION MAMMAPLASTY Bilateral 2017  . COLONOSCOPY WITH PROPOFOL N/A 04/23/2016   Procedure: COLONOSCOPY WITH PROPOFOL;  Surgeon: Lollie Sails, MD;  Location: [facility] ENDOSCOPY;  Service: Endoscopy;  Laterality: N/A;  . DIAGNOSTIC LAPAROSCOPY  2010   DR. SCHERMERHORN - ENDOMETRIOSIS  . ENDOMETRIAL BIOPSY      Family History  Problem Relation Age of Onset  . Breast cancer Mother 57       TAH/BSO BEFORE BR CA, IS MyRisk NEG  . Colon polyps Mother   . Thyroid cancer Mother   . Breast cancer Maternal Grandmother 70  . Cancer Paternal Grandfather        lung    Social History   Socioeconomic History  .  Marital status: Single    Spouse name: Not on file  . Number of children: Not on file  . Years of education: 57  . Highest education level: Not on file  Occupational History    Employer: [employer]  Tobacco Use  . Smoking status: Former Research scientist (life sciences)  . Smokeless tobacco: Former Network engineer  . Vaping Use: Every day  Substance and Sexual Activity  . Alcohol use: Yes    Comment: rarely  . Drug use: No  . Sexual activity: Yes    Birth control/protection: None, Condom  Other Topics Concern  . Not on file  Social History Narrative  . Not on file   Social Determinants of Health   Financial Resource Strain:   . Difficulty of Paying Living Expenses: Not on file  Food Insecurity:   . Worried About Charity fundraiser in the Last Year: Not on file  . Ran Out of Food in the Last Year: Not on file  Transportation Needs:   . Lack of Transportation (Medical): Not on file  . Lack of Transportation (Non-Medical): Not on file  Physical Activity:   . Days of Exercise per Week: Not on file  . Minutes of Exercise per Session: Not on file  Stress:   . Feeling of Stress : Not on file  Social Connections:   .  Frequency of Communication with Friends and Family: Not on file  . Frequency of Social Gatherings with Friends and Family: Not on file  . Attends Religious Services: Not on file  . Active Member of Clubs or Organizations: Not on file  . Attends Archivist Meetings: Not on file  . Marital Status: Not on file  Intimate Partner Violence:   . Fear of Current or Ex-Partner: Not on file  . Emotionally Abused: Not on file  . Physically Abused: Not on file  . Sexually Abused: Not on file    Outpatient Medications Prior to Visit  Medication Sig Dispense Refill  . budesonide (RHINOCORT ALLERGY) 32 MCG/ACT nasal spray Place 2 sprays into both nostrils daily.    . Cholecalciferol (VITAMIN D3) 125 MCG (5000 UT) TABS Take by mouth.    . EPINEPHrine 0.3 mg/0.3 mL IJ SOAJ injection  SMARTSIG:Injection IM As Directed    . levocetirizine (XYZAL) 5 MG tablet every evening.  5  . loratadine (CLARITIN) 10 MG tablet Take by mouth.    . montelukast (SINGULAIR) 10 MG tablet Take 10 mg by mouth daily.  5  . SUNOSI 150 MG TABS Take 1 tablet by mouth 2 (two) times daily.    . Azelastine-Fluticasone (DYMISTA) 137-50 MCG/ACT SUSP USE 1 SPRAY INTO EACH NOSTRIL TWICE A DAY    . SUNOSI 75 MG TABS     . Zinc 50 MG TABS Take by mouth.     No facility-administered medications prior to visit.      ROS:  Review of Systems  Constitutional: Positive for fatigue. Negative for fever.  Gastrointestinal: Negative for blood in stool, constipation, diarrhea, nausea and vomiting.  Genitourinary: Negative for dyspareunia, dysuria, flank pain, frequency, hematuria, urgency, vaginal bleeding, vaginal discharge and vaginal pain.  Musculoskeletal: Negative for back pain.  Skin: Negative for rash.  Psychiatric/Behavioral: Positive for agitation and dysphoric mood.    OBJECTIVE:   Vitals:  BP 100/60   Ht _0  (1.651 m)   Wt 129 lb (58.5 kg)   LMP 03/20/2020 (Exact Date)   BMI 21.47 kg/m   Physical Exam Vitals reviewed.  Constitutional:      Appearance: She is well-developed.  Pulmonary:     Effort: Pulmonary effort is normal.  Musculoskeletal:        General: Normal range of motion.     Cervical back: Normal range of motion.  Skin:    General: Skin is warm and dry.  Neurological:     General: No focal deficit present.     Mental Status: She is alert and oriented to person, place, and time.     Cranial Nerves: No cranial nerve deficit.  Psychiatric:        Mood and Affect: Mood normal.        Behavior: Behavior normal.        Thought Content: Thought content normal.        Judgment: Judgment normal.     Assessment/Plan: PMDD (premenstrual dysphoric disorder) - Plan: sertraline (ZOLOFT) 50 MG tablet; Discussed SSRIs. Will try zoloft luteal phase. Discussed ok to take daily  if it makes her feel better in general with overall life stressors. F/u at 11/21 annual/sooner prn  Chronic fatigue - Plan: Testosterone,Free and Total, Cortisol; recheck labs today. If cortisol low, pt most likely has adrenal fatigue and can discuss mgmt options.   Abnormal laboratory test result - Plan: Testosterone,Free and Total, Cortisol    Meds ordered this encounter  Medications  .  sertraline (ZOLOFT) 50 MG tablet    Sig: Take 1 tab daily    Dispense:  30 tablet    Refill:  1    Order Specific Question:   Supervising Provider    Answer:   Gae Dry [[id number]]      Return if symptoms worsen or fail to improve.  Lissete Maestas B. Joao Mccurdy, PA-C 03/28/2020 10:28 AM

## 2020-03-28 ENCOUNTER — Other Ambulatory Visit: Payer: Self-pay

## 2020-03-28 ENCOUNTER — Encounter: Payer: Self-pay | Admitting: Obstetrics and Gynecology

## 2020-03-28 ENCOUNTER — Ambulatory Visit (INDEPENDENT_AMBULATORY_CARE_PROVIDER_SITE_OTHER): Payer: No Typology Code available for payment source | Admitting: Obstetrics and Gynecology

## 2020-03-28 VITALS — BP 100/60 | Ht 65.0 in | Wt 129.0 lb

## 2020-03-28 DIAGNOSIS — F3281 Premenstrual dysphoric disorder: Secondary | ICD-10-CM | POA: Diagnosis not present

## 2020-03-28 DIAGNOSIS — R5382 Chronic fatigue, unspecified: Secondary | ICD-10-CM

## 2020-03-28 DIAGNOSIS — R899 Unspecified abnormal finding in specimens from other organs, systems and tissues: Secondary | ICD-10-CM

## 2020-03-28 MED ORDER — SERTRALINE HCL 50 MG PO TABS: ORAL_TABLET | ORAL | 1 refills | Status: DC

## 2020-03-28 NOTE — Patient Instructions (Signed)
I value your feedback and entrusting us with your care. If you get a Veronica Chan patient survey, I would appreciate you taking the time to let us know about your experience today. Thank you!  As of June 24, 2019, your lab results will be released to your MyChart immediately, before I even have a chance to see them. Please give me time to review them and contact you if there are any abnormalities. Thank you for your patience.  

## 2020-04-01 LAB — TESTOSTERONE,FREE AND TOTAL
Testosterone, Free: 1.3 pg/mL (ref 0.0–4.2)
Testosterone: 8 ng/dL (ref 8–60)

## 2020-04-01 LAB — CORTISOL: Cortisol: 11.4 ug/dL

## 2020-04-20 ENCOUNTER — Other Ambulatory Visit: Payer: Self-pay | Admitting: Obstetrics and Gynecology

## 2020-04-20 DIAGNOSIS — F3281 Premenstrual dysphoric disorder: Secondary | ICD-10-CM

## 2020-05-08 ENCOUNTER — Other Ambulatory Visit: Payer: Self-pay | Admitting: Obstetrics and Gynecology

## 2020-05-08 DIAGNOSIS — F3281 Premenstrual dysphoric disorder: Secondary | ICD-10-CM

## 2020-05-22 ENCOUNTER — Ambulatory Visit (INDEPENDENT_AMBULATORY_CARE_PROVIDER_SITE_OTHER): Payer: No Typology Code available for payment source | Admitting: Internal Medicine

## 2020-05-22 DIAGNOSIS — G4712 Idiopathic hypersomnia without long sleep time: Secondary | ICD-10-CM | POA: Insufficient documentation

## 2020-05-22 DIAGNOSIS — G479 Sleep disorder, unspecified: Secondary | ICD-10-CM | POA: Diagnosis not present

## 2020-05-22 MED ORDER — SUNOSI 150 MG PO TABS: 1.0000 | ORAL_TABLET | Freq: Two times a day (BID) | ORAL | 0 refills | Status: DC

## 2020-05-22 NOTE — Progress Notes (Signed)
Sleep Medicine   Office Visit  Patient Name: Pandora Mccrackin DOB: 12/01/1985 MRN [id number]    Chief Complaint: hypersomnia   HISTORY OF PRESENT ILLNESS: Caree is seen today for follow up for idiopathic hypersomnia. The patient has been taking Sunosi at the following dose 150 bid. The patient reports symptoms have somewhat improved since starting medication. Sleep quality is about the same since starting medication and the patient continues to have daytime sleepiness. The patient reports no negative side effects from the medication. Wakes up around 7:30-8 am, goes to bed around 9 pm. Does not fall asleep until about 10-11 pm. Wakes up about every 2-3 hours, says she wakes up in the middle of her dreams.   ROS  General: (-) fever, (-) chills, (-) night sweat Nose and Sinuses: (-) nasal stuffiness or itchiness, (-) postnasal drip, (-) nosebleeds, (-) sinus trouble. Mouth and Throat: (-) sore throat, (-) hoarseness. Neck: (-) swollen glands, (-) enlarged thyroid, (-) neck pain. Respiratory: - cough, - shortness of breath, - wheezing. Neurologic: - numbness, - tingling. Psychiatric: - anxiety, - depression Sleep behavior: -sleep paralysis -hypnogogic hallucinations -dream enactment  -vivid dreams -cataplexy -night terrors -sleep walking   Current Medication: Outpatient Encounter Medications as of 05/22/2020  Medication Sig  . budesonide (RHINOCORT ALLERGY) 32 MCG/ACT nasal spray Place 2 sprays into both nostrils daily.  . Cholecalciferol (VITAMIN D3) 125 MCG (5000 UT) TABS Take by mouth.  . EPINEPHrine 0.3 mg/0.3 mL IJ SOAJ injection SMARTSIG:Injection IM As Directed  . levocetirizine (XYZAL) 5 MG tablet every evening.  . loratadine (CLARITIN) 10 MG tablet Take by mouth.  . montelukast (SINGULAIR) 10 MG tablet Take 10 mg by mouth daily.  . sertraline (ZOLOFT) 50 MG tablet Take 1 tab daily  . SUNOSI 150 MG TABS Take 1 tablet by mouth 2 (two) times daily.  . [DISCONTINUED] SUNOSI  150 MG TABS Take 1 tablet by mouth 2 (two) times daily.   No facility-administered encounter medications on file as of 05/22/2020.    Surgical History: Past Surgical History:  Procedure Laterality Date  . AUGMENTATION MAMMAPLASTY Bilateral 2017  . COLONOSCOPY WITH PROPOFOL N/A 04/23/2016   Procedure: COLONOSCOPY WITH PROPOFOL;  Surgeon: Lollie Sails, MD;  Location: [facility] ENDOSCOPY;  Service: Endoscopy;  Laterality: N/A;  . DIAGNOSTIC LAPAROSCOPY  2010   DR. SCHERMERHORN - ENDOMETRIOSIS  . ENDOMETRIAL BIOPSY      Medical History: Past Medical History:  Diagnosis Date  . Acne   . BRCA negative   . Chronic constipation   . Endometriosis   . Family history of breast cancer 03/2012   Pt's IBIS=34.2%10/15/  Pt's mom is MyRisk neg.  Marland Kitchen History of Papanicolaou smear of cervix 04/02/2013; 05/17/2014   neg; neg  . Mood swings   . Narcolepsy     Family History: Non contributory to the present illness  Social History: Social History   Socioeconomic History  . Marital status: Single    Spouse name: Not on file  . Number of children: Not on file  . Years of education: 35  . Highest education level: Not on file  Occupational History    Employer: [employer]  Tobacco Use  . Smoking status: Former Research scientist (life sciences)  . Smokeless tobacco: Former Network engineer  . Vaping Use: Every day  Substance and Sexual Activity  . Alcohol use: Yes    Comment: rarely  . Drug use: No  . Sexual activity: Yes    Birth control/protection: None, Condom  Other  Topics Concern  . Not on file  Social History Narrative  . Not on file   Social Determinants of Health   Financial Resource Strain:   . Difficulty of Paying Living Expenses: Not on file  Food Insecurity:   . Worried About Charity fundraiser in the Last Year: Not on file  . Ran Out of Food in the Last Year: Not on file  Transportation Needs:   . Lack of Transportation (Medical): Not on file  . Lack of Transportation (Non-Medical): Not on  file  Physical Activity:   . Days of Exercise per Week: Not on file  . Minutes of Exercise per Session: Not on file  Stress:   . Feeling of Stress : Not on file  Social Connections:   . Frequency of Communication with Friends and Family: Not on file  . Frequency of Social Gatherings with Friends and Family: Not on file  . Attends Religious Services: Not on file  . Active Member of Clubs or Organizations: Not on file  . Attends Archivist Meetings: Not on file  . Marital Status: Not on file  Intimate Partner Violence:   . Fear of Current or Ex-Partner: Not on file  . Emotionally Abused: Not on file  . Physically Abused: Not on file  . Sexually Abused: Not on file    Vital Signs: Height _0  (1.651 m), weight 127 lb (57.6 kg).  Examination: General Appearance: The patient is well-developed, well-nourished, and in no distress. Neck Circumference: N/A Skin: Gross inspection of skin unremarkable. Head: normocephalic, no gross deformities. Eyes: no gross deformities noted. ENT: ears appear grossly normal Neurologic: Alert and oriented. No involuntary movements.    SLEEP STUDIES:  1. PSG/MSLT 09/2011 SOL of 1:12 min and one SOREM   LABS: Recent Results (from the past 2160 hour(s))  Testosterone,Free and Total     Status: None   Collection Time: 03/28/20 11:03 AM  Result Value Ref Range   Testosterone 8 8 - 60 ng/dL   Testosterone, Free 1.3 0.0 - 4.2 pg/mL  Cortisol     Status: None   Collection Time: 03/28/20 11:03 AM  Result Value Ref Range   Cortisol 11.4 ug/dL    Comment:                         Cortisol AM         6.2 - 19.4                         Cortisol PM         2.3 - 11.9     Radiology: MR BREAST BILATERAL W WO CONTRAST INC CAD  Result Date: 03/07/2020 CLINICAL DATA:  High risk breast cancer screening study. Patient has strong maternal family history of breast cancer. LABS:  None obtained at the time of imaging. EXAM: BILATERAL BREAST MRI WITH  AND WITHOUT CONTRAST TECHNIQUE: Multiplanar, multisequence MR images of both breasts were obtained prior to and following the intravenous administration of 5 ml of Gadavist Three-dimensional MR images were rendered by post-processing of the original MR data on an independent workstation. The three-dimensional MR images were interpreted, and findings are reported in the following complete MRI report for this study. Three dimensional images were evaluated at the independent interpreting workstation using the DynaCAD thin client. COMPARISON:  Screening mammogram on 07/06/2019 FINDINGS: Breast composition: c. Heterogeneous fibroglandular tissue. Background parenchymal enhancement: Minimal Right  breast: No mass or abnormal enhancement. Patient has retroglandular silicone implant. Left breast: No mass or abnormal enhancement. Patient has retroglandular silicone implant. Lymph nodes: No abnormal appearing lymph nodes. Ancillary findings:  None. IMPRESSION: No MRI evidence for malignancy. RECOMMENDATION: Recommend screening mammogram in December 2021 Based on the recommendations of the [place], annual screening MRI is suggested in addition to annual mammography if the patient has an estimated lifetime risk of developing breast cancer which is greater than 20%. BI-RADS CATEGORY  1: Negative. Electronically Signed   By: Nolon Nations M.D.   On: 03/07/2020 15:51    Assessment and Plan: Patient Active Problem List   Diagnosis Date Noted  . 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 Sleep disturbance 05/22/2020  . PMDD (premenstrual dysphoric disorder) 03/28/2020  . 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 Low grade squamous intraepith lesion on cytologic smear cervix (lgsil) 06/09/2018  . Family history of breast cancer 06/09/2018  . Increased risk of breast cancer 06/09/2018   1. 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  Dispense: 60 tablet; Refill: 0  2. Sleep disturbance Will need to utilize sleep hygiene habits Dr. Saunders Glance shared to optimize sleep time at night to improve daytime sleepiness  General Counseling: I have discussed the findings of the evaluation and examination with Raigan.  I have also discussed any further diagnostic evaluation thatmay be needed or ordered today. Zacaria verbalizes understanding of the findings of todays visit. We also reviewed her medications today and discussed drug interactions and side effects including but not limited excessive drowsiness and altered mental states. We also discussed that there is always a risk not just to her but also people around her. she has been encouraged to call the office with any questions or concerns that should arise related to todays visit.  I have personally obtained a history, evaluated the patient, evaluated pertinent data, formulated the assessment and plan and placed orders.  This patient was seen by Luiz Ochoa, AGNP-C in collaboration with Dr. Devona Konig as a part of collaborative care agreement.  Richelle Ito Saunders Glance, PhD, FAASM  Diplomate, American Board of Sleep Medicine    Allyne Gee, MD [facility] Diplomate ABMS Pulmonary and Critical Care Medicine Sleep medicine

## 2020-05-22 NOTE — Patient Instructions (Signed)

## 2020-06-09 ENCOUNTER — Other Ambulatory Visit: Payer: Self-pay | Admitting: Hospice and Palliative Medicine

## 2020-06-09 DIAGNOSIS — G4712 Idiopathic hypersomnia without long sleep time: Secondary | ICD-10-CM

## 2020-06-12 ENCOUNTER — Other Ambulatory Visit: Payer: Self-pay | Admitting: Hospice and Palliative Medicine

## 2020-06-12 NOTE — Telephone Encounter (Signed)
Can you send this to pharmacy for refill please.

## 2020-06-26 ENCOUNTER — Ambulatory Visit: Payer: No Typology Code available for payment source | Admitting: Internal Medicine

## 2020-06-26 DIAGNOSIS — Z91199 Patient's noncompliance with other medical treatment and regimen due to unspecified reason: Secondary | ICD-10-CM

## 2020-06-26 DIAGNOSIS — Z5329 Procedure and treatment not carried out because of patient's decision for other reasons: Secondary | ICD-10-CM

## 2020-06-26 NOTE — Progress Notes (Signed)
error 

## 2020-06-26 NOTE — Progress Notes (Signed)
Patient was no-show for appointment.  The office staff will contact the patient for rescheduling follow-up. 

## 2020-07-22 ENCOUNTER — Other Ambulatory Visit: Payer: Self-pay

## 2020-07-22 ENCOUNTER — Encounter: Payer: Self-pay | Admitting: Intensive Care

## 2020-07-22 DIAGNOSIS — Z5321 Procedure and treatment not carried out due to patient leaving prior to being seen by health care provider: Secondary | ICD-10-CM | POA: Insufficient documentation

## 2020-07-22 DIAGNOSIS — T783XXA Angioneurotic edema, initial encounter: Secondary | ICD-10-CM | POA: Diagnosis not present

## 2020-07-22 DIAGNOSIS — R21 Rash and other nonspecific skin eruption: Secondary | ICD-10-CM | POA: Diagnosis present

## 2020-07-22 LAB — CBC WITH DIFFERENTIAL/PLATELET
Abs Immature Granulocytes: 0.03 10*3/uL (ref 0.00–0.07)
Basophils Absolute: 0 10*3/uL (ref 0.0–0.1)
Basophils Relative: 0 %
Eosinophils Absolute: 0.1 10*3/uL (ref 0.0–0.5)
Eosinophils Relative: 1 %
HCT: 35.4 % — ABNORMAL LOW (ref 36.0–46.0)
Hemoglobin: 11.9 g/dL — ABNORMAL LOW (ref 12.0–15.0)
Immature Granulocytes: 0 %
Lymphocytes Relative: 26 %
Lymphs Abs: 2.5 10*3/uL (ref 0.7–4.0)
MCH: 32 pg (ref 26.0–34.0)
MCHC: 33.6 g/dL (ref 30.0–36.0)
MCV: 95.2 fL (ref 80.0–100.0)
Monocytes Absolute: 0.5 10*3/uL (ref 0.1–1.0)
Monocytes Relative: 5 %
Neutro Abs: 6.5 10*3/uL (ref 1.7–7.7)
Neutrophils Relative %: 68 %
Platelets: 325 10*3/uL (ref 150–400)
RBC: 3.72 MIL/uL — ABNORMAL LOW (ref 3.87–5.11)
RDW: 12.6 % (ref 11.5–15.5)
WBC: 9.6 10*3/uL (ref 4.0–10.5)
nRBC: 0 % (ref 0.0–0.2)

## 2020-07-22 LAB — COMPREHENSIVE METABOLIC PANEL
ALT: 18 U/L (ref 0–44)
AST: 26 U/L (ref 15–41)
Albumin: 4.5 g/dL (ref 3.5–5.0)
Alkaline Phosphatase: 33 U/L — ABNORMAL LOW (ref 38–126)
Anion gap: 9 (ref 5–15)
BUN: 9 mg/dL (ref 6–20)
CO2: 27 mmol/L (ref 22–32)
Calcium: 9.5 mg/dL (ref 8.9–10.3)
Chloride: 102 mmol/L (ref 98–111)
Creatinine, Ser: 0.58 mg/dL (ref 0.44–1.00)
GFR, Estimated: >60 mL/min (ref >60)
Glucose, Bld: 94 mg/dL (ref 70–99)
Potassium: 3.9 mmol/L (ref 3.5–5.1)
Sodium: 138 mmol/L (ref 135–145)
Total Bilirubin: 0.6 mg/dL (ref 0.3–1.2)
Total Protein: 7.9 g/dL (ref 6.5–8.1)

## 2020-07-22 MED ORDER — METHYLPREDNISOLONE SODIUM SUCC 125 MG IJ SOLR
125.0000 mg | Freq: Once | INTRAMUSCULAR | Status: AC
  Administered 2020-07-22: 125 mg via INTRAVENOUS
  Filled 2020-07-22: qty 2

## 2020-07-22 MED ORDER — DIPHENHYDRAMINE HCL 50 MG/ML IJ SOLN
50.0000 mg | Freq: Once | INTRAMUSCULAR | Status: AC
  Administered 2020-07-22: 50 mg via INTRAVENOUS
  Filled 2020-07-22: qty 1

## 2020-07-22 NOTE — ED Triage Notes (Addendum)
Patient presents with angioedema, red splotchy rash all over body, swollen elbows, and reports when she swallows that it feels like something is stuck in her throat. Denies trouble breathing. Reports waking up this AM with the swollen elbows and gradually throughout day all the other symptoms appeared. Speech clear

## 2020-07-23 ENCOUNTER — Emergency Department
Admission: EM | Admit: 2020-07-23 | Discharge: 2020-07-23 | Disposition: A | Discharge status: Home / Self Care | Payer: No Typology Code available for payment source | Attending: Emergency Medicine | Admitting: Emergency Medicine

## 2020-07-23 ENCOUNTER — Ambulatory Visit (HOSPITAL_COMMUNITY)
Admission: EM | Admit: 2020-07-23 | Discharge: 2020-07-23 | Disposition: A | Discharge status: Home / Self Care | Payer: No Typology Code available for payment source | Attending: Internal Medicine | Admitting: Internal Medicine

## 2020-07-23 ENCOUNTER — Other Ambulatory Visit: Payer: Self-pay

## 2020-07-23 ENCOUNTER — Encounter (HOSPITAL_COMMUNITY): Payer: Self-pay | Admitting: *Deleted

## 2020-07-23 DIAGNOSIS — L509 Urticaria, unspecified: Secondary | ICD-10-CM | POA: Diagnosis not present

## 2020-07-23 DIAGNOSIS — T7840XA Allergy, unspecified, initial encounter: Secondary | ICD-10-CM | POA: Diagnosis not present

## 2020-07-23 HISTORY — DX: Lyme disease, unspecified: A69.20

## 2020-07-23 HISTORY — DX: Idiopathic hypersomnia with long sleep time: G47.11

## 2020-07-23 MED ORDER — METHYLPREDNISOLONE SODIUM SUCC 125 MG IJ SOLR
INTRAMUSCULAR | Status: AC
  Filled 2020-07-23: qty 2

## 2020-07-23 MED ORDER — METHYLPREDNISOLONE SODIUM SUCC 125 MG IJ SOLR
125.0000 mg | Freq: Once | INTRAMUSCULAR | Status: AC
  Administered 2020-07-23: 125 mg via INTRAMUSCULAR

## 2020-07-23 MED ORDER — PREDNISONE 50 MG PO TABS: ORAL_TABLET | ORAL | 0 refills | Status: DC

## 2020-07-23 NOTE — Discharge Instructions (Addendum)
You have been given another injection of steroids at urgent care today.  Please start oral prednisone tomorrow.  Continue Pepcid 20 mg daily as needed and Benadryl 25 mg every 6 hours as needed.  You will need to go to the emergency department should you feel any throat or tongue swelling or any difficulty breathing.  Make sure you are carrying your EpiPen with you to use as needed.   You would likely benefit from follow-up with your allergist.

## 2020-07-23 NOTE — ED Notes (Signed)
Patient called with no answser

## 2020-07-23 NOTE — ED Notes (Signed)
Patient called with no answer. °

## 2020-07-23 NOTE — ED Triage Notes (Signed)
Patient with allergic reaction started yesterday. Was given steroids and benadryl via IV. States that allergic reaction has continued. No airway involvement. Unsure what caused reaction.

## 2020-07-23 NOTE — ED Provider Notes (Signed)
[place]    CSN: [id number] Arrival date & time: 07/23/20  1601      History   Chief Complaint Chief Complaint  Patient presents with  . Allergic Reaction    HPI Veronica Chan is a 35 y.o. female presents to urgent care with reports of allergic reaction.  Patient reports awakening yesterday with generalized itching and rash and "tickling" feeling in her throat.  She initially presented to emergency department where she was given IV Solu-Medrol and Benadryl in triage.  Patient states she was feeling better after  medication and left prior to evaluation by provider.  States lip swelling and feeling of throat swelling has subsided but still with generalized hives and funny feeling in throat.  She denies any lip or throat swelling, difficulty breathing, no shortness of breath.  Uncertain of cause.  Last dose of Benadryl at 1500.  Pepcid 20 mg this morning.  Allergy testing done a couple years ago showed multiple environmental allergies but no food allergies.  Patient states she was unable to tolerate allergy injections.    Past Medical History:  Diagnosis Date  . Acne   . BRCA negative   . Chronic constipation   . Endometriosis   . Family history of breast cancer 03/2012   Pt's IBIS=34.2%10/15/  Pt's mom is MyRisk neg.  Marland Kitchen History of Papanicolaou smear of cervix 04/02/2013; 05/17/2014   neg; neg  . Idiopathic hypersomnia   . Lyme disease   . Mood swings   . Narcolepsy     Patient Active Problem List   Diagnosis Date Noted  . 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 Sleep disturbance 05/22/2020  . PMDD (premenstrual dysphoric disorder) 03/28/2020  . 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 Low grade squamous intraepith lesion on cytologic smear cervix (lgsil) 06/09/2018  . Family history of breast cancer 06/09/2018  . Increased risk of breast cancer 06/09/2018    Past Surgical History:  Procedure Laterality Date  . AUGMENTATION MAMMAPLASTY  Bilateral 2017  . COLONOSCOPY WITH PROPOFOL N/A 04/23/2016   Procedure: COLONOSCOPY WITH PROPOFOL;  Surgeon: Lollie Sails, MD;  Location: [facility] ENDOSCOPY;  Service: Endoscopy;  Laterality: N/A;  . DIAGNOSTIC LAPAROSCOPY  2010   DR. SCHERMERHORN - ENDOMETRIOSIS  . ENDOMETRIAL BIOPSY      OB History    Gravida  0   Para  0   Term  0   Preterm  0   AB  0   Living  0     SAB  0   IAB  0   Ectopic  0   Multiple  0   Live Births  0            Home Medications    Prior to Admission medications   Medication Sig Start Date End Date Taking? Authorizing Provider  predniSONE (DELTASONE) 50 MG tablet Take 1 tablet daily until gone 07/23/20  Yes Rudolpho Sevin, NP  budesonide (RHINOCORT ALLERGY) 32 MCG/ACT nasal spray Place 2 sprays into both nostrils daily.    [provider]  Cholecalciferol (VITAMIN D3) 125 MCG (5000 UT) TABS Take by mouth.    [provider]  EPINEPHrine 0.3 mg/0.3 mL IJ SOAJ injection SMARTSIG:Injection IM As Directed 01/20/20   [provider]  levocetirizine (XYZAL) 5 MG tablet every evening. 04/16/18   [provider]  loratadine (CLARITIN) 10 MG tablet Take by mouth.    [provider]  montelukast (SINGULAIR) 10 MG tablet  Take 10 mg by mouth daily. 04/16/18   [provider]  sertraline (ZOLOFT) 50 MG tablet Take 1 tab daily 9/79/89   Copland, Deirdre Evener, PA-C  SUNOSI 150 MG TABS TAKE 1 TABLET BY MOUTH TWICE A DAY 06/12/20   Luiz Ochoa, NP    Family History Family History  Problem Relation Age of Onset  . Breast cancer Mother 57       TAH/BSO BEFORE BR CA, IS MyRisk NEG  . Colon polyps Mother   . Thyroid cancer Mother   . Breast cancer Maternal Grandmother 66  . Cancer Paternal Grandfather        lung    Social History Social History   Tobacco Use  . Smoking status: Former Research scientist (life sciences)  . Smokeless tobacco: Former Network engineer  . Vaping Use: Every day  Substance Use Topics  .  Alcohol use: Yes    Comment: rarely  . Drug use: No     Allergies   Modafinil   Review of Systems As stated in HPI otherwise negative   Physical Exam Triage Vital Signs ED Triage Vitals  Enc Vitals Group     BP 07/23/20 1657 92/69     Pulse Rate 07/23/20 1657 76     Resp 07/23/20 1657 17     Temp 07/23/20 1657 97.8 F (36.6 C)     Temp Source 07/23/20 1657 Temporal     SpO2 07/23/20 1657 100 %     Weight --      Height --      Head Circumference --      Peak Flow --      Pain Score 07/23/20 1655 0     Pain Loc --      Pain Edu? --      Excl. in [place]? --    No data found.  Updated Vital Signs BP 92/69 (BP Location: Right Arm)   Pulse 76   Temp 97.8 F (36.6 C) (Temporal)   Resp 17   LMP 07/02/2020 (Exact Date)   SpO2 100%   Visual Acuity Right Eye Distance:   Left Eye Distance:   Bilateral Distance:    Right Eye Near:   Left Eye Near:    Bilateral Near:     Physical Exam Constitutional:      General: She is not in acute distress.    Appearance: Normal appearance. She is not ill-appearing.  HENT:     Nose: Nose normal.     Mouth/Throat:     Mouth: Mucous membranes are moist.     Pharynx: Oropharynx is clear.  Eyes:     General: No scleral icterus. Cardiovascular:     Rate and Rhythm: Normal rate and regular rhythm.  Pulmonary:     Effort: Pulmonary effort is normal.     Breath sounds: Normal breath sounds. No stridor.  Musculoskeletal:     Cervical back: Normal range of motion and neck supple.  Lymphadenopathy:     Cervical: No cervical adenopathy.  Skin:    General: Skin is warm and dry.     Findings: Rash present.     Comments: Hives noted along chin, neck, back and arms  Neurological:     General: No focal deficit present.     Mental Status: She is alert and oriented to person, place, and time.  Psychiatric:        Mood and Affect: Mood normal.        Behavior: Behavior  normal.      UC Treatments / Results  Labs (all labs  ordered are listed, but only abnormal results are displayed) Labs Reviewed - No data to display  EKG   Radiology No results found.  Procedures Procedures (including critical care time)  Medications Ordered in UC Medications  methylPREDNISolone sodium succinate (SOLU-MEDROL) 125 mg/2 mL injection 125 mg (125 mg Intramuscular Given 07/23/20 1737)    Initial Impression / Assessment and Plan / UC Course  I have reviewed the triage vital signs and the nursing notes.  Pertinent labs & imaging results that were available during my care of the patient were reviewed by me and considered in my medical decision making (see chart for details).  Allergic reaction/hives Unclear etiology.  Airway intact without any evidence of respiratory distress or airway involvement.  Patient received IV Solu-Medrol and Benadryl in ED last evening prior to without being seen.  This provided some relief.  Will administer IM Decadron in office and prescribe short prednisone burst.  Patient to continue Pepcid 20 mg daily as well as Benadryl 25 mg every 6 hours as needed.  Patient already carries EpiPen with her and enforce the importance of this.  Discussed strict return to ER precautions.  Patient verifies understanding.  Reviewed expections re: course of current medical issues. Questions answered. Outlined signs and symptoms indicating need for more acute intervention. Pt verbalized understanding. AVS given  Final Clinical Impressions(s) / UC Diagnoses   Final diagnoses:  Allergic reaction, initial encounter  Hives     Discharge Instructions     You have been given another injection of steroids at urgent care today.  Please start oral prednisone tomorrow.  Continue Pepcid 20 mg daily as needed and Benadryl 25 mg every 6 hours as needed.  You will need to go to the emergency department should you feel any throat or tongue swelling or any difficulty breathing.  Make sure you are carrying your EpiPen with you  to use as needed.   You would likely benefit from follow-up with your allergist.    ED Prescriptions    Medication Sig Dispense Auth. Provider   predniSONE (DELTASONE) 50 MG tablet Take 1 tablet daily until gone 5 tablet Rudolpho Sevin, NP     PDMP not reviewed this encounter.   Rudolpho Sevin, NP 07/23/20 1739

## 2020-08-09 ENCOUNTER — Other Ambulatory Visit: Payer: Self-pay

## 2020-08-09 ENCOUNTER — Ambulatory Visit (INDEPENDENT_AMBULATORY_CARE_PROVIDER_SITE_OTHER): Payer: No Typology Code available for payment source | Admitting: Obstetrics and Gynecology

## 2020-08-09 ENCOUNTER — Encounter: Payer: Self-pay | Admitting: Obstetrics and Gynecology

## 2020-08-09 ENCOUNTER — Other Ambulatory Visit (HOSPITAL_COMMUNITY)
Admission: RE | Admit: 2020-08-09 | Discharge: 2020-08-09 | Disposition: A | Discharge status: Home / Self Care | Payer: No Typology Code available for payment source | Source: Ambulatory Visit | Attending: Obstetrics and Gynecology | Admitting: Obstetrics and Gynecology

## 2020-08-09 VITALS — BP 110/80 | Ht 65.0 in | Wt 136.0 lb

## 2020-08-09 DIAGNOSIS — Z124 Encounter for screening for malignant neoplasm of cervix: Secondary | ICD-10-CM

## 2020-08-09 DIAGNOSIS — Z803 Family history of malignant neoplasm of breast: Secondary | ICD-10-CM

## 2020-08-09 DIAGNOSIS — Z1151 Encounter for screening for human papillomavirus (HPV): Secondary | ICD-10-CM

## 2020-08-09 DIAGNOSIS — N946 Dysmenorrhea, unspecified: Secondary | ICD-10-CM

## 2020-08-09 DIAGNOSIS — F3281 Premenstrual dysphoric disorder: Secondary | ICD-10-CM

## 2020-08-09 DIAGNOSIS — N870 Mild cervical dysplasia: Secondary | ICD-10-CM

## 2020-08-09 DIAGNOSIS — N809 Endometriosis, unspecified: Secondary | ICD-10-CM

## 2020-08-09 DIAGNOSIS — Z01419 Encounter for gynecological examination (general) (routine) without abnormal findings: Secondary | ICD-10-CM

## 2020-08-09 DIAGNOSIS — Z9189 Other specified personal risk factors, not elsewhere classified: Secondary | ICD-10-CM

## 2020-08-09 DIAGNOSIS — Z1231 Encounter for screening mammogram for malignant neoplasm of breast: Secondary | ICD-10-CM

## 2020-08-09 MED ORDER — NAPROXEN SODIUM 550 MG PO TABS: 550.0000 mg | ORAL_TABLET | Freq: Two times a day (BID) | ORAL | 2 refills | Status: DC

## 2020-08-09 MED ORDER — SERTRALINE HCL 50 MG PO TABS: ORAL_TABLET | ORAL | 3 refills | Status: DC

## 2020-08-09 NOTE — Progress Notes (Signed)
Chief Complaint  Patient presents with  . Gynecologic Exam    No concerns     HPI:      [name] is a 35 y.o. G0P0000 who LMP was Patient's last menstrual period was 07/24/2020 (exact date)., presents today for her annual examination.  Her menses are regular every 28-30 days, lasting 5-6 days, mod flow. Dysmenorrhea moderate, occurring 2 weeks each month. She does not have intermenstrual bleeding. She has a hx of endometriosis (diagnosed on dx lap 2010 at [facility]) and has pressure/pain 1 1/2 weeks before and during her period. Takes midol, tylenol, heating, lies down with some relief. Has 2 really bad days with menses. Not missing work but able to work from home anyway and lies down. She did lupron for 6 months summer 2017 with significant sx improvement. Pt doesn't like the way she feels on mult OCPs, so never started f/u med. Also with PMDD 10 days before menses and doing sertraline 50 mg with sx control. Wants to cont med.  Sex activity: currently sexually active with occas dyspareunia. Using condoms. Declines BC for now. Partner to get vasectomy.  Last Pap: 06/14/19 Results were no abn/POS HPV DNA; repeat 7/21 was negative cells. Repeat pap due today. Hx of 05/20/18  Results were no abnormalities/ POS HPV DNA.  12/24/16 with LGSIL/pos HPV DNA; Colpo done 01/28/17 with Dr. Kenton Kingfisher with CIN1 on bx and 1/21 with CIN 1. Hx of STDs: HPV  Last mammogram: 07/06/19  Results were normal, repeat in 1 yr due to FH/increased risk of breast cancer. Had neg breast MRI 03/07/20.  There is a FH of breast cancer in her mom and MGM. There is no FH of ovarian cancer.   Pt is BRCA neg and her mom was My Risk neg. Pt's IBIS=34%. She takes Vit D supp. The patient does not do self-breast exams.  Tobacco use: The patient denies current or previous tobacco use. Alcohol use: rare No drug use Exercise: moderately active  She does get adequate calcium and Vitamin D in her diet.   Past Medical History:   Diagnosis Date  . Acne   . BRCA negative   . Chronic constipation   . Endometriosis   . Family history of breast cancer 03/2012   Pt's IBIS=34.2%10/15/  Pt's mom is MyRisk neg.  Marland Kitchen History of Papanicolaou smear of cervix 04/02/2013; 05/17/2014   neg; neg  . Idiopathic hypersomnia   . Lyme disease   . Mood swings   . Narcolepsy     Past Surgical History:  Procedure Laterality Date  . AUGMENTATION MAMMAPLASTY Bilateral 2017  . COLONOSCOPY WITH PROPOFOL N/A 04/23/2016   Procedure: COLONOSCOPY WITH PROPOFOL;  Surgeon: Lollie Sails, MD;  Location: [facility] ENDOSCOPY;  Service: Endoscopy;  Laterality: N/A;  . DIAGNOSTIC LAPAROSCOPY  2010   DR. SCHERMERHORN - ENDOMETRIOSIS  . ENDOMETRIAL BIOPSY      Family History  Problem Relation Age of Onset  . Breast cancer Mother 24       TAH/BSO BEFORE BR CA, IS MyRisk NEG  . Colon polyps Mother   . Thyroid cancer Mother   . Breast cancer Maternal Grandmother 13  . Cancer Paternal Grandfather        lung    Social History   Socioeconomic History  . Marital status: Single    Spouse name: Not on file  . Number of children: Not on file  . Years of education: 57  . Highest education level: Not on  file  Occupational History    Employer: [employer]  Tobacco Use  . Smoking status: Former Research scientist (life sciences)  . Smokeless tobacco: Former Network engineer  . Vaping Use: Every day  Substance and Sexual Activity  . Alcohol use: Yes    Comment: rarely  . Drug use: No  . Sexual activity: Yes    Birth control/protection: None, Condom  Other Topics Concern  . Not on file  Social History Narrative  . Not on file   Social Determinants of Health   Financial Resource Strain: Not on file  Food Insecurity: Not on file  Transportation Needs: Not on file  Physical Activity: Not on file  Stress: Not on file  Social Connections: Not on file  Intimate Partner Violence: Not on file     Current Outpatient Medications:  .  budesonide (RHINOCORT AQUA) 32  MCG/ACT nasal spray, Place 2 sprays into both nostrils daily., Disp: , Rfl:  .  Cholecalciferol (VITAMIN D3) 125 MCG (5000 UT) TABS, Take by mouth., Disp: , Rfl:  .  EPINEPHrine 0.3 mg/0.3 mL IJ SOAJ injection, SMARTSIG:Injection IM As Directed, Disp: , Rfl:  .  ipratropium (ATROVENT) 0.06 % nasal spray, Place into both nostrils., Disp: , Rfl:  .  levocetirizine (XYZAL) 5 MG tablet, every evening., Disp: , Rfl: 5 .  loratadine (CLARITIN) 10 MG tablet, Take by mouth., Disp: , Rfl:  .  montelukast (SINGULAIR) 10 MG tablet, Take 10 mg by mouth daily., Disp: , Rfl: 5 .  naproxen sodium (ANAPROX DS) 550 MG tablet, Take 1 tablet (550 mg total) by mouth 2 (two) times daily with a meal. Prn sx, Disp: 30 tablet, Rfl: 2 .  sertraline (ZOLOFT) 50 MG tablet, Take 1 tab daily, Disp: 30 tablet, Rfl: 3  ROS:  Review of Systems  Constitutional: Negative for fatigue, fever and unexpected weight change.  Respiratory: Negative for cough, shortness of breath and wheezing.   Cardiovascular: Negative for chest pain, palpitations and leg swelling.  Gastrointestinal: Negative for blood in stool, constipation, diarrhea, nausea and vomiting.  Endocrine: Negative for cold intolerance, heat intolerance and polyuria.  Genitourinary: Positive for dyspareunia. Negative for dysuria, flank pain, frequency, genital sores, hematuria, menstrual problem, pelvic pain, urgency, vaginal bleeding, vaginal discharge and vaginal pain.  Musculoskeletal: Negative for back pain, joint swelling and myalgias.  Skin: Negative for rash.  Neurological: Negative for dizziness, syncope, light-headedness, numbness and headaches.  Hematological: Negative for adenopathy.  Psychiatric/Behavioral: Negative for agitation, confusion, sleep disturbance and suicidal ideas. The patient is not nervous/anxious.      Objective: BP 110/80   Ht $R'5\' 5"'rq$  (1.651 m)   Wt 136 lb (61.7 kg)   LMP 07/24/2020 (Exact Date)   BMI 22.63 kg/m    Physical  Exam Constitutional:      Appearance: She is well-developed.  Genitourinary:     Vulva normal.     Right Labia: No rash, tenderness or lesions.    Left Labia: No tenderness, lesions or rash.    No vaginal discharge, erythema or tenderness.      Right Adnexa: not tender and no mass present.    Left Adnexa: not tender and no mass present.    No cervical motion tenderness, friability or polyp.     Uterus is not enlarged or tender.  Breasts:     Right: No mass, nipple discharge, skin change or tenderness.     Left: No mass, nipple discharge, skin change or tenderness.    Neck:  Thyroid: No thyromegaly.  Cardiovascular:     Rate and Rhythm: Normal rate and regular rhythm.     Heart sounds: Normal heart sounds. No murmur heard.   Pulmonary:     Effort: Pulmonary effort is normal.     Breath sounds: Normal breath sounds.  Abdominal:     Palpations: Abdomen is soft.     Tenderness: There is no abdominal tenderness. There is no guarding or rebound.  Musculoskeletal:        General: Normal range of motion.     Cervical back: Normal range of motion.  Lymphadenopathy:     Cervical: No cervical adenopathy.  Neurological:     General: No focal deficit present.     Mental Status: She is alert and oriented to person, place, and time.     Cranial Nerves: No cranial nerve deficit.  Skin:    General: Skin is warm and dry.  Psychiatric:        Mood and Affect: Mood normal.        Behavior: Behavior normal.        Thought Content: Thought content normal.        Judgment: Judgment normal.  Vitals reviewed.     Assessment/Plan: Encounter for annual routine gynecological examination  Cervical cancer screening - Plan: Cytology - PAP  Screening for HPV (human papillomavirus) - Plan: Cytology - PAP  CIN I (cervical intraepithelial neoplasia I) - Plan: Cytology - PAP. Repeat today, will f/u with results.  Encounter for screening mammogram for malignant neoplasm of breast - Plan: MM  3D SCREEN BREAST BILATERAL; pt to sched mammo  Family history of breast cancer - Plan: MM 3D SCREEN BREAST BILATERAL  Increased risk of breast cancer - Plan: MM 3D SCREEN BREAST BILATERAL; pt aware of monthly SBE, yearly CBE and mammos, as well as scr breast MRI. Pt to do mammo and f/u if desires MRI ref. Cont Vit D supp  PMDD (premenstrual dysphoric disorder) - Plan: sertraline (ZOLOFT) 50 MG tablet; doing well. Rx RF.   Dysmenorrhea - Plan: naproxen sodium (ANAPROX DS) 550 MG tablet  Endometriosis - Plan: naproxen sodium (ANAPROX DS) 550 MG tablet; try Rx anaprox, declines BC. Also discussed Freida Busman and pt to research to see if she wants to try it. Had terrible vasomotor sx with lupron in past but did help sx.    Meds ordered this encounter  Medications  . naproxen sodium (ANAPROX DS) 550 MG tablet    Sig: Take 1 tablet (550 mg total) by mouth 2 (two) times daily with a meal. Prn sx    Dispense:  30 tablet    Refill:  2    Order Specific Question:   Supervising Provider    Answer:   Gae Dry [id number]  . sertraline (ZOLOFT) 50 MG tablet    Sig: Take 1 tab daily    Dispense:  30 tablet    Refill:  3    Order Specific Question:   Supervising Provider    Answer:   Gae Dry [[id number]]              GYN counsel breast self exam, mammography screening, adequate intake of calcium and vitamin D, diet and exercise     F/U  Return in about 1 year (around 08/09/2021).  Brieonna Crutcher B. Gildardo Tickner, PA-C 08/09/2020 11:35 AM

## 2020-08-09 NOTE — Patient Instructions (Signed)
I value your feedback and you entrusting us with your care. If you get a [facility] patient survey, I would appreciate you taking the time to let us know about your experience today. Thank you! ? ? ?

## 2020-08-11 LAB — CYTOLOGY - PAP
Adequacy: ABSENT
Comment: NEGATIVE
Diagnosis: NEGATIVE
High risk HPV: POSITIVE — AB

## 2020-08-28 ENCOUNTER — Other Ambulatory Visit: Payer: Self-pay | Admitting: Obstetrics and Gynecology

## 2020-08-28 DIAGNOSIS — Z1231 Encounter for screening mammogram for malignant neoplasm of breast: Secondary | ICD-10-CM

## 2020-08-28 DIAGNOSIS — Z9189 Other specified personal risk factors, not elsewhere classified: Secondary | ICD-10-CM

## 2020-08-28 DIAGNOSIS — Z803 Family history of malignant neoplasm of breast: Secondary | ICD-10-CM

## 2020-09-02 ENCOUNTER — Other Ambulatory Visit: Payer: Self-pay | Admitting: Obstetrics and Gynecology

## 2020-09-02 DIAGNOSIS — F3281 Premenstrual dysphoric disorder: Secondary | ICD-10-CM

## 2020-09-21 ENCOUNTER — Other Ambulatory Visit: Payer: Self-pay

## 2020-09-21 ENCOUNTER — Ambulatory Visit
Admission: RE | Admit: 2020-09-21 | Discharge: 2020-09-21 | Disposition: A | Discharge status: Home / Self Care | Payer: No Typology Code available for payment source | Source: Ambulatory Visit | Attending: Obstetrics and Gynecology | Admitting: Obstetrics and Gynecology

## 2020-09-21 DIAGNOSIS — Z803 Family history of malignant neoplasm of breast: Secondary | ICD-10-CM | POA: Insufficient documentation

## 2020-09-21 DIAGNOSIS — Z1231 Encounter for screening mammogram for malignant neoplasm of breast: Secondary | ICD-10-CM | POA: Diagnosis present

## 2020-09-21 DIAGNOSIS — Z9189 Other specified personal risk factors, not elsewhere classified: Secondary | ICD-10-CM

## 2020-09-21 IMAGING — MG DIGITAL SCREENING BREAST BILAT IMPLANT W/ TOMO W/ CAD
8 of 14 series · 8 of 34 positions shown · non-contrast
Comparison: Previous exam(s).

CLINICAL DATA: Screening.

EXAM:
DIGITAL SCREENING BILATERAL MAMMOGRAM WITH IMPLANTS, CAD AND
TOMOSYNTHESIS
TECHNIQUE: Bilateral screening digital craniocaudal and mediolateral oblique
mammograms were obtained. Bilateral screening digital breast
tomosynthesis was performed. The images were evaluated with
computer-aided detection. Standard and/or implant displaced views
were performed.

[R MLO]
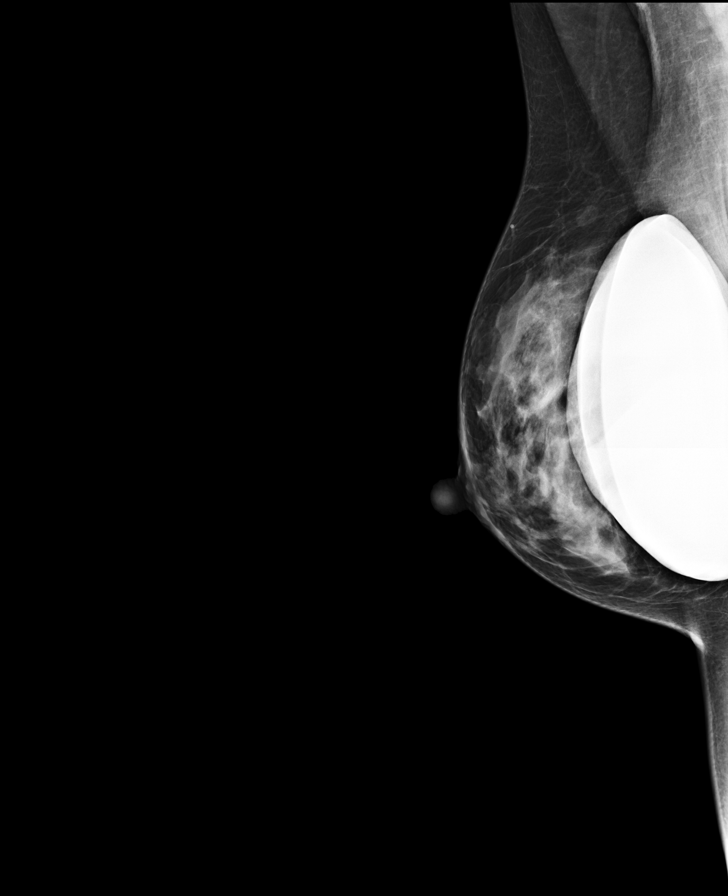

[L MLO]
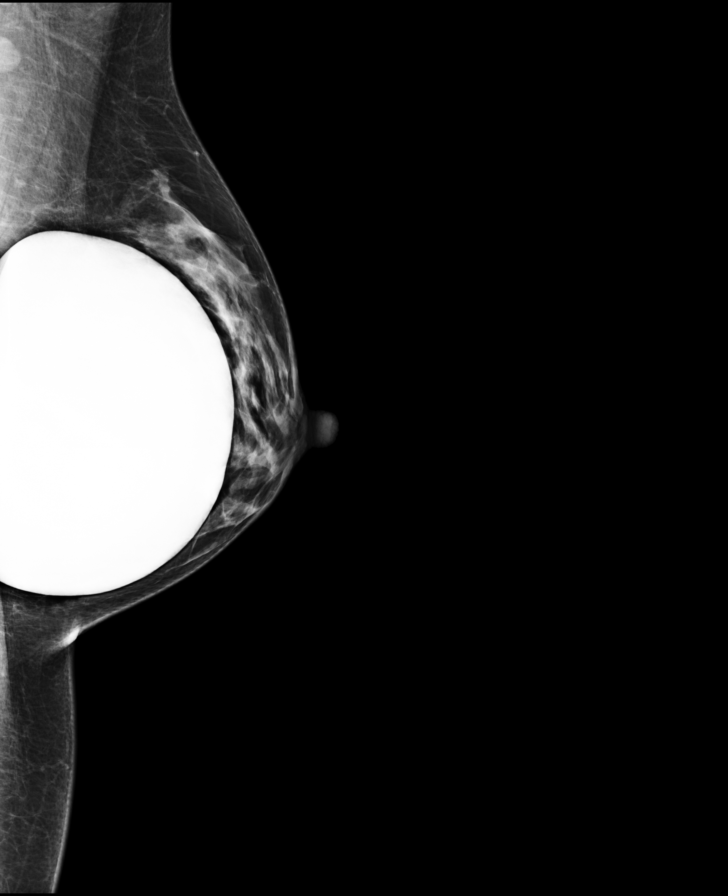

[L CC]
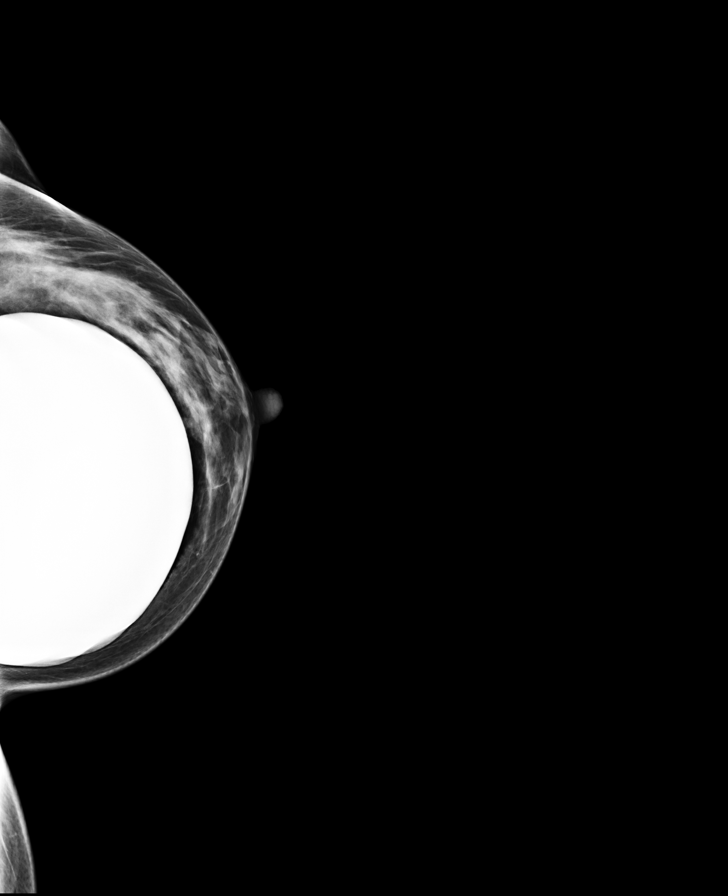

[R CC]
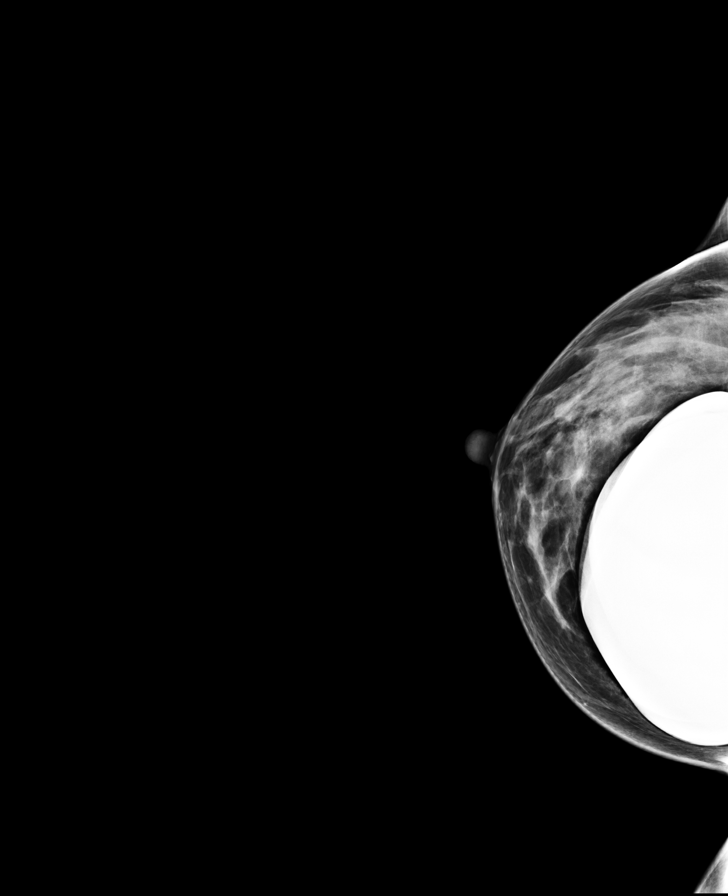

[L CC synth-2D]
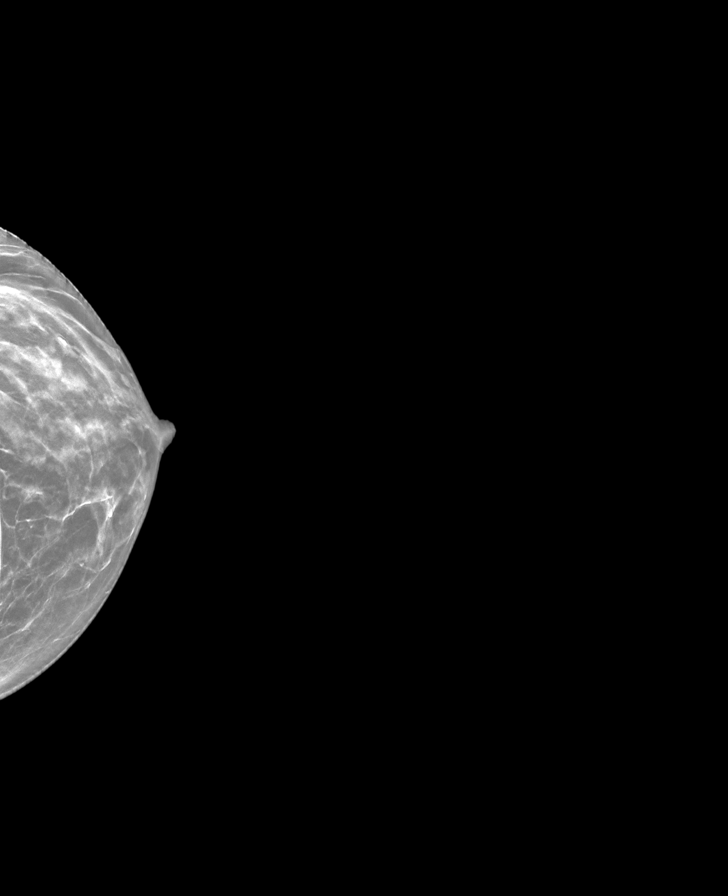

[R CC synth-2D]
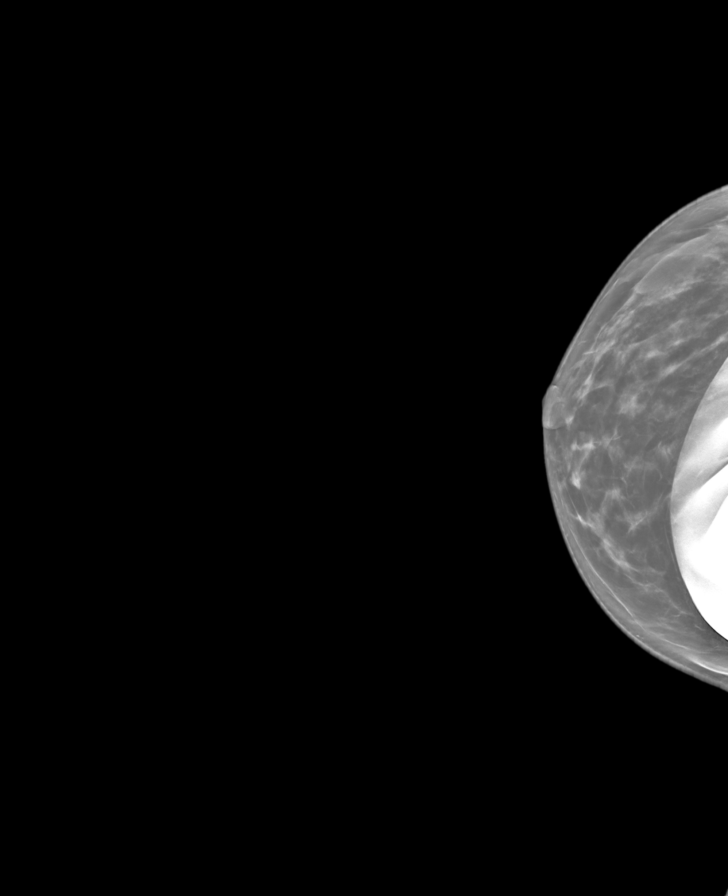

[L MLO synth-2D (1 of 2)]
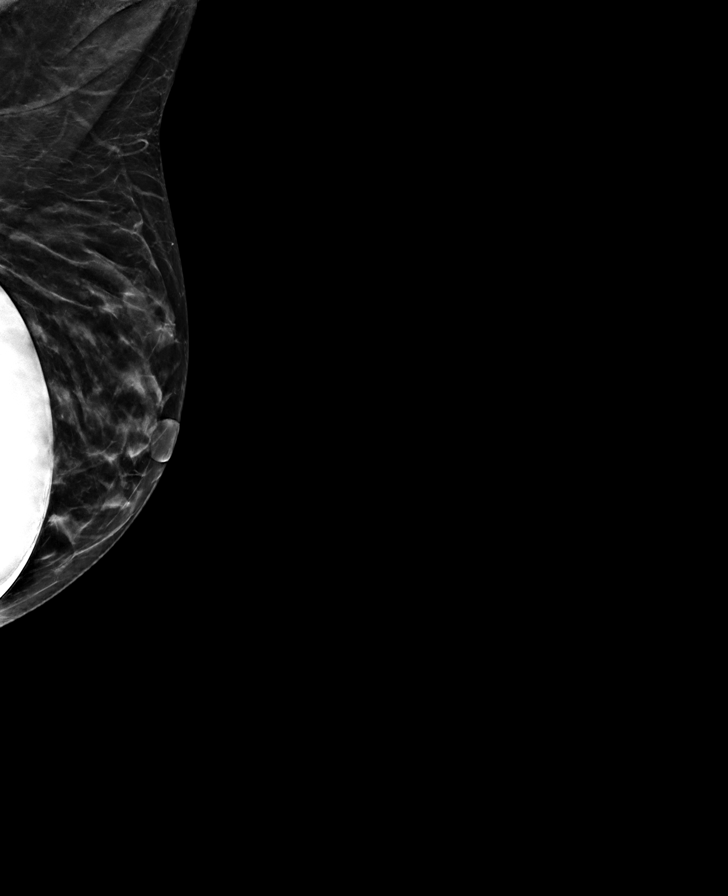

[L MLO synth-2D (2 of 2)]
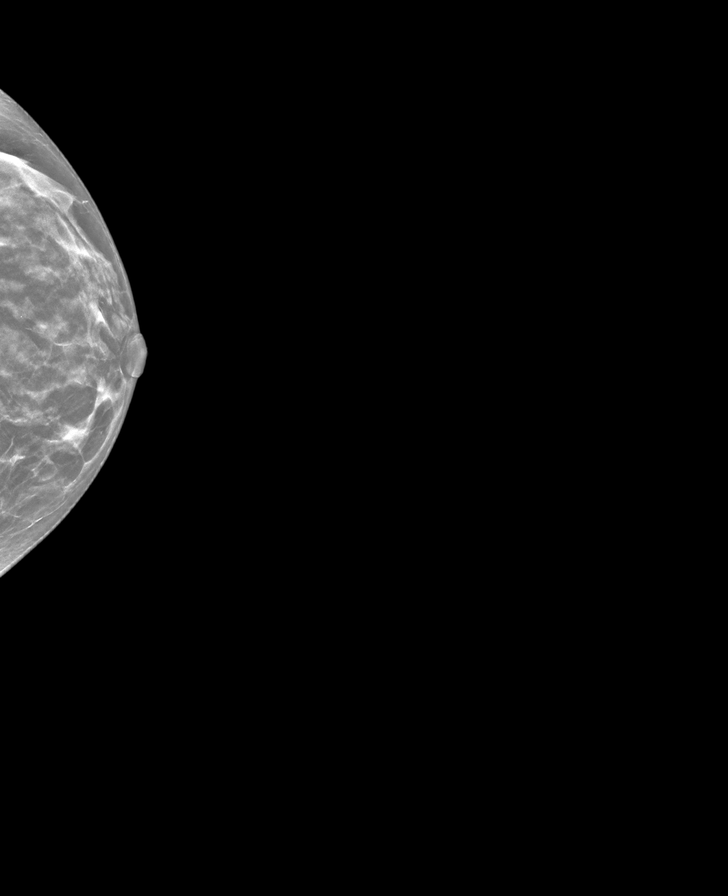

[8 of 34 positions shown; findings below may reference images not displayed]

ACR Breast Density Category c: The breast tissue is heterogeneously
dense, which may obscure small masses.
FINDINGS: The patient has retropectoral implants. In the right breast, a
possible asymmetry warrants further evaluation. In the left breast,
no suspicious masses or malignant type calcifications are
identified.
IMPRESSION: Further evaluation is suggested for possible asymmetry in the right
breast.

RECOMMENDATION:
3D diagnostic mammogram and possibly ultrasound of the right breast.
(Code:[PX])

The patient will be contacted regarding the findings, and additional
imaging will be scheduled.

BI-RADS CATEGORY  0: Incomplete. Need additional imaging evaluation
and/or prior mammograms for comparison.

## 2020-09-25 ENCOUNTER — Other Ambulatory Visit: Payer: Self-pay | Admitting: Obstetrics and Gynecology

## 2020-09-25 DIAGNOSIS — N6489 Other specified disorders of breast: Secondary | ICD-10-CM

## 2020-09-25 DIAGNOSIS — R928 Other abnormal and inconclusive findings on diagnostic imaging of breast: Secondary | ICD-10-CM

## 2020-10-02 ENCOUNTER — Other Ambulatory Visit: Payer: Self-pay

## 2020-10-02 ENCOUNTER — Ambulatory Visit
Admission: RE | Admit: 2020-10-02 | Discharge: 2020-10-02 | Disposition: A | Discharge status: Home / Self Care | Payer: No Typology Code available for payment source | Source: Ambulatory Visit | Attending: Obstetrics and Gynecology | Admitting: Obstetrics and Gynecology

## 2020-10-02 DIAGNOSIS — R928 Other abnormal and inconclusive findings on diagnostic imaging of breast: Secondary | ICD-10-CM | POA: Diagnosis present

## 2020-10-02 DIAGNOSIS — N6489 Other specified disorders of breast: Secondary | ICD-10-CM | POA: Diagnosis present

## 2020-10-02 IMAGING — US US BREAST*R* LIMITED INC AXILLA
1 series · 11 of 11 positions shown · non-contrast
Comparison: Previous exam(s).

CLINICAL DATA: 34-year-old female presenting as a recall from
screening for possible right breast asymmetries.

EXAM:
DIGITAL DIAGNOSTIC UNILATERAL RIGHT MAMMOGRAM WITH IMPLANTS, CAD AND
TOMOSYNTHESIS; ULTRASOUND RIGHT BREAST LIMITED
TECHNIQUE: Right digital diagnostic mammography and breast tomosynthesis was
performed. The images were evaluated with computer-aided detection.
Standard and/or implant displaced views were performed.; Targeted
ultrasound examination of the right breast was performed

[Series 1: us breast*right* limited inc axilla · 0.04mm/px · 11 of 11 slices shown]
[im 1/11]
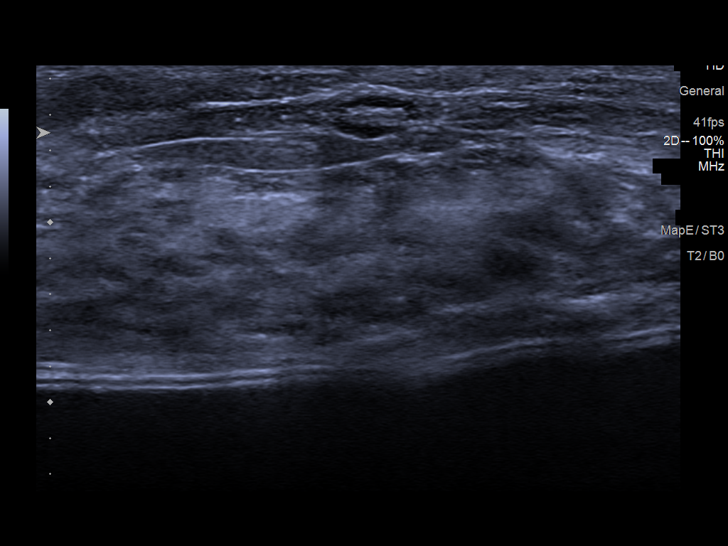
[im 2/11]
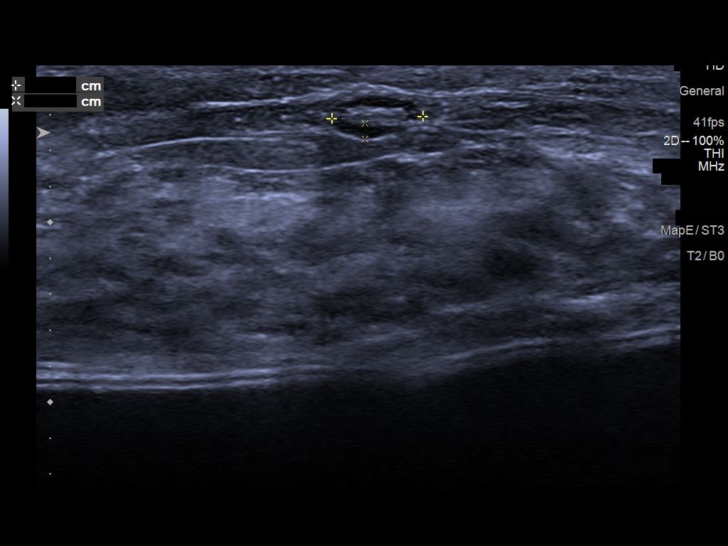
[im 3/11]
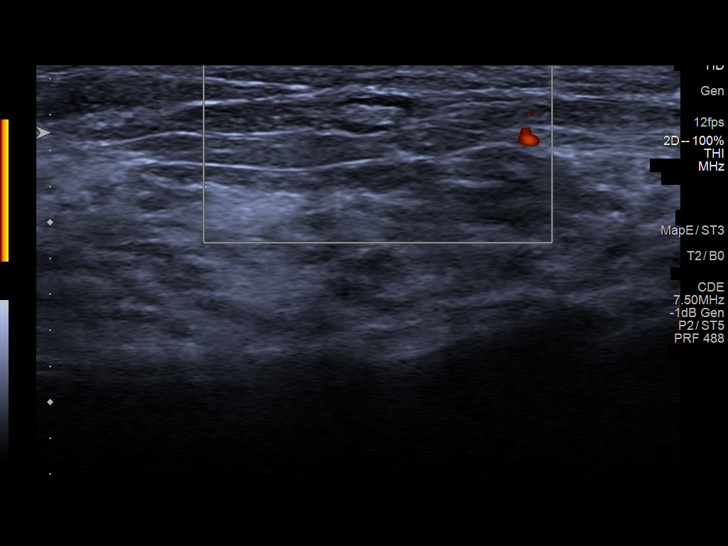
[im 4/11]
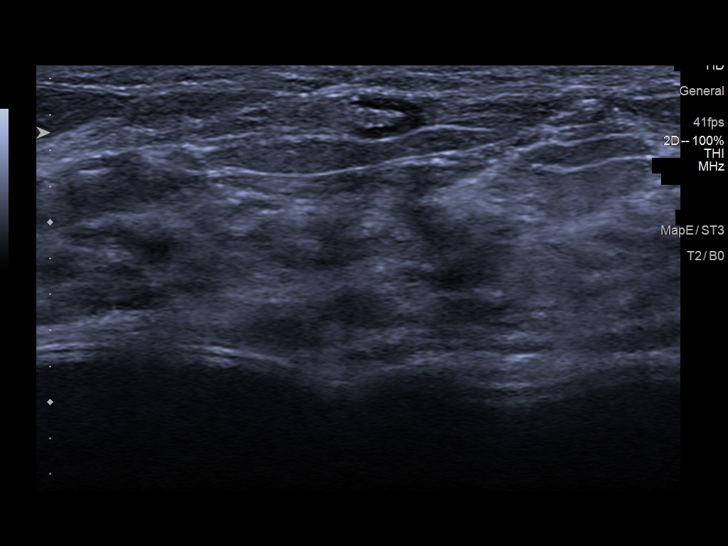
[im 5/11]
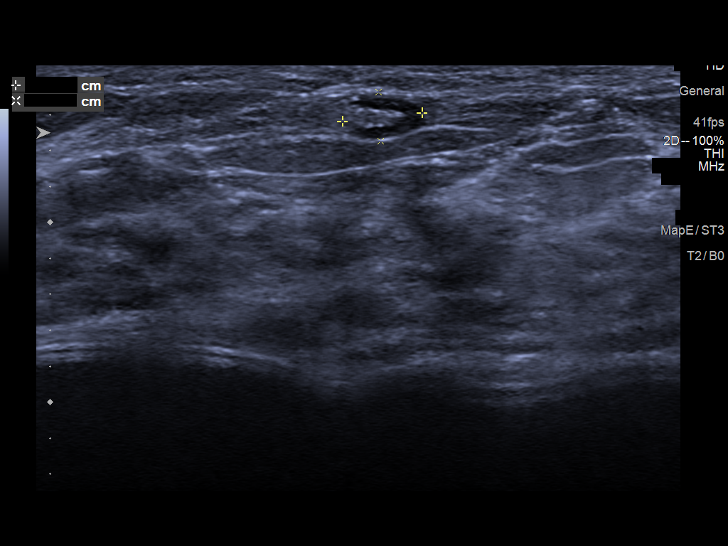
[im 6/11]
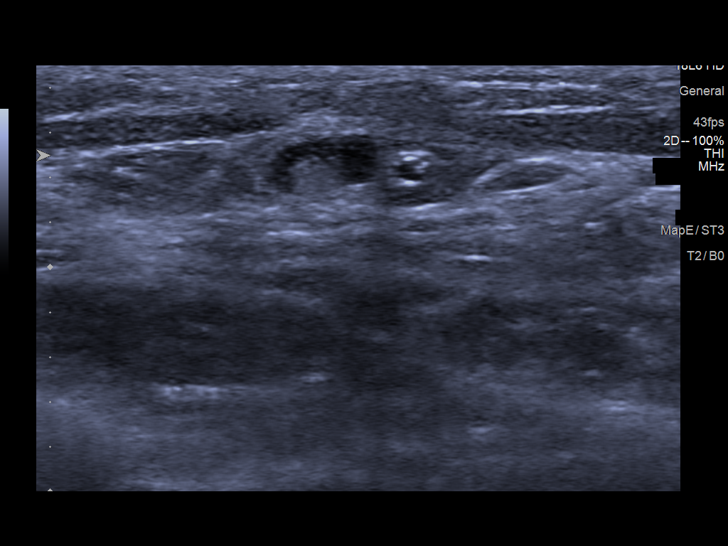
[im 7/11]
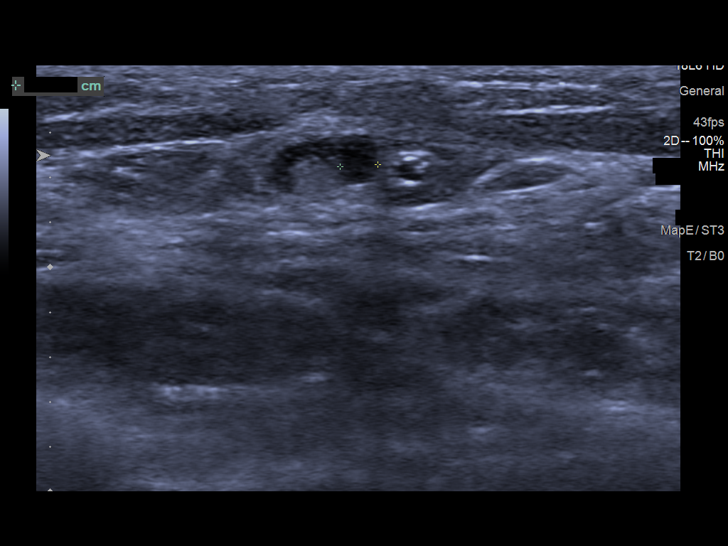
[im 8/11]
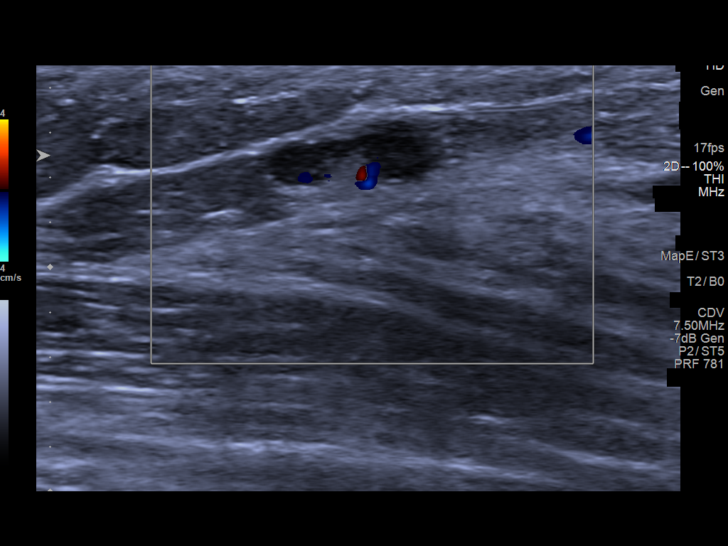
[im 9/11]
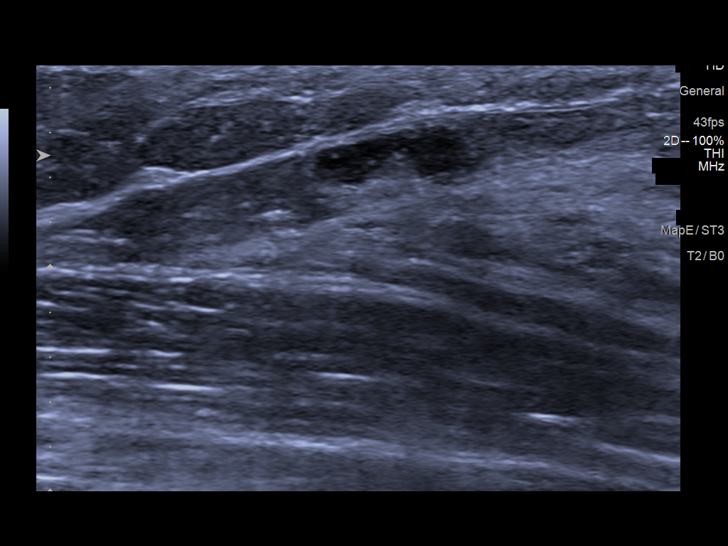
[im 10/11]
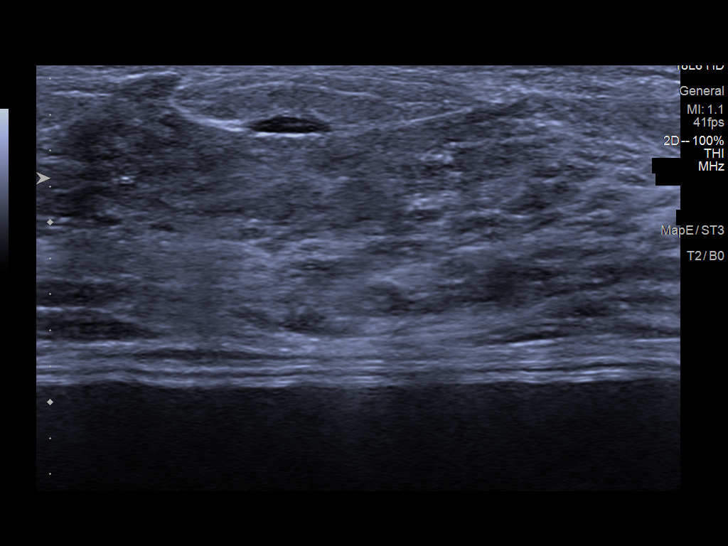
[im 11/11]
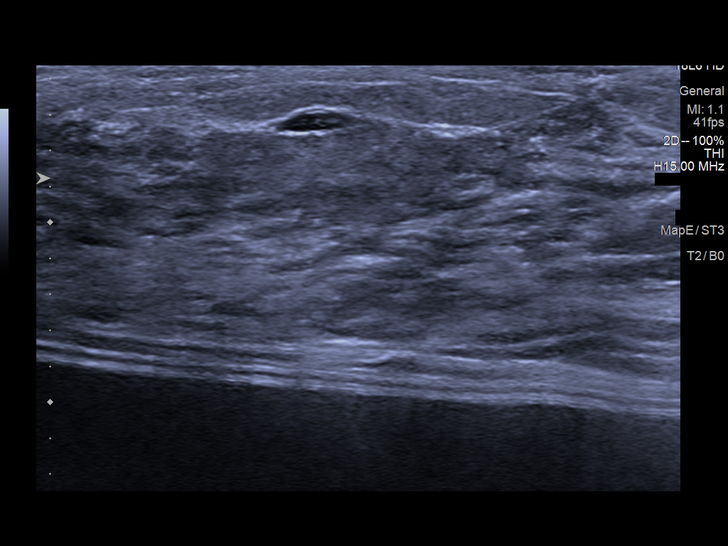

[11 of 11 positions shown; findings below may reference images not displayed]

ACR Breast Density Category c: The breast tissue is heterogeneously
dense, which may obscure small masses.
FINDINGS: Mammogram:

Spot compression tomosynthesis implant displaced MLO and full field
implant displaced mL tomosynthesis views of the right breast were
performed. The questioned small mass seen in the upper outer right
breast anterior depth does not definitely persist on the additional
imaging. There is a persistent small oval mass in the upper outer
right breast posterior depth measuring approximately 0.5 cm, with
implant overlapping.

Ultrasound:

Targeted ultrasound is performed throughout the upper-outer quadrant
of the right breast demonstrating multiple small benign intramammary
lymph nodes. There is one at 9:30 o'clock 6 cm from nipple measuring
0.5 x 0.9 x 0.5 cm. Another is seen in the right axillary tail with
cortical thickness measuring up to 0.2 cm. There is a third small
benign intramammary lymph node at 10 o'clock 4 cm from nipple. No
suspicious solid mass identified.
IMPRESSION: Multiple benign intramammary lymph nodes in the upper outer right
breast and axillary tail. No mammographic or sonographic evidence of
malignancy.

RECOMMENDATION:
Continue routine annual screening mammography as well as annual
screening breast MRI given high risk status.

I have discussed the findings and recommendations with the patient.
If applicable, a reminder letter will be sent to the patient
regarding the next appointment.

BI-RADS CATEGORY  2: Benign.

## 2020-10-03 ENCOUNTER — Encounter: Payer: Self-pay | Admitting: Obstetrics and Gynecology

## 2020-11-06 ENCOUNTER — Telehealth: Payer: Self-pay

## 2020-11-06 NOTE — Telephone Encounter (Signed)
Pt calling; is interested in starting [place]; does she need appt?  Was told to call if she decided to start it.  [phone]

## 2020-11-07 ENCOUNTER — Other Ambulatory Visit: Payer: Self-pay | Admitting: Obstetrics and Gynecology

## 2020-11-07 DIAGNOSIS — N809 Endometriosis, unspecified: Secondary | ICD-10-CM

## 2020-11-07 MED ORDER — ORILISSA 150 MG PO TABS
150.0000 mg | ORAL_TABLET | Freq: Every day | ORAL | 2 refills | Status: DC
Start: 2020-11-07 — End: 2021-02-15

## 2020-11-07 NOTE — Telephone Encounter (Signed)
LMTRC

## 2020-11-07 NOTE — Telephone Encounter (Signed)
Done on orders noted

## 2020-11-07 NOTE — Progress Notes (Signed)
Pt would like to try orilissa for endometriosis. Did lupron in the past, had diagnosed endometriosis on dx lap 2010. Doesn't feel good on hormonal BC. Pelvic pain worsening each cycle.  Discussed side effects, risks. Start with LMP. Use condoms, partner has vasect scheduled. RTO for f/u in 3 months/sooner prn.  Rx eRxd, get coupon card online.

## 2021-02-01 ENCOUNTER — Other Ambulatory Visit: Payer: Self-pay | Admitting: Obstetrics and Gynecology

## 2021-02-01 DIAGNOSIS — N809 Endometriosis, unspecified: Secondary | ICD-10-CM

## 2021-02-14 NOTE — Progress Notes (Signed)
Dione Housekeeper, MD   Chief Complaint  Patient presents with   Follow-up    No concerns    HPI:      [name] is a 35 y.o. G0P0000 whose LMP was No LMP recorded. (Menstrual status: Oral contraceptives)., presents today for f/u on Orilissa for endometriosis, started 4/22. Pt is amenorrheic since starting it. Dysmen and dyspareunia much improved. Had bad vasomotor sx initially but they have improved. No other side effects. Is sex active with some vaginal dryness, improved with lubricants. Also using condoms every time; partner to get vasectomy this yr.   Pt also has increased risk of breast cancer and does yearly breast MRI. Last mammo 3/22, would like to sched MRI. Done last yr at [facility] in 8/21.  1/22 NOTE: She has a hx of endometriosis (diagnosed on dx lap 2010 at [facility]) and has pressure/pain 1 1/2 weeks before and during her period. Takes midol, tylenol, heating, lies down with some relief. Has 2 really bad days with menses. Not missing work but able to work from home anyway and lies down. 4/22 Pt would like to try orilissa for endometriosis. Did lupron in the past, had diagnosed endometriosis on dx lap 2010. Doesn't feel good on hormonal BC. Pelvic pain worsening each cycle. Discussed side effects, risks. Start with LMP. Use condoms, partner has vasect scheduled. RTO for f/u in 3 months/sooner prn. Rx eRxd, get coupon card online.   Past Medical History:  Diagnosis Date   Acne    BRCA negative    Chronic constipation    Endometriosis    Family history of breast cancer 03/2012   Pt's IBIS=34.2%10/15/  Pt's mom is MyRisk neg.   History of Papanicolaou smear of cervix 04/02/2013; 05/17/2014   neg; neg   Idiopathic hypersomnia    Lyme disease    Mood swings    Narcolepsy     Past Surgical History:  Procedure Laterality Date   AUGMENTATION MAMMAPLASTY Bilateral 2017   COLONOSCOPY WITH PROPOFOL N/A 04/23/2016   Procedure: COLONOSCOPY WITH PROPOFOL;  Surgeon:  Christena Deem, MD;  Location: [facility] ENDOSCOPY;  Service: Endoscopy;  Laterality: N/A;   DIAGNOSTIC LAPAROSCOPY  2010   DR. SCHERMERHORN - ENDOMETRIOSIS   ENDOMETRIAL BIOPSY      Family History  Problem Relation Age of Onset   Breast cancer Mother 33       TAH/BSO BEFORE BR CA, IS MyRisk NEG   Colon polyps Mother    Thyroid cancer Mother    Breast cancer Maternal Grandmother 53   Cancer Paternal Grandfather        lung    Social History   Socioeconomic History   Marital status: Single    Spouse name: Not on file   Number of children: Not on file   Years of education: 14   Highest education level: Not on file  Occupational History    Employer: [employer]  Tobacco Use   Smoking status: Former   Smokeless tobacco: Former  Building services engineer Use: Every day  Substance and Sexual Activity   Alcohol use: Yes    Comment: rarely   Drug use: No   Sexual activity: Yes    Birth control/protection: None, Condom  Other Topics Concern   Not on file  Social History Narrative   Not on file   Social Determinants of Health   Financial Resource Strain: Not on file  Food Insecurity: Not on file  Transportation Needs: Not  on file  Physical Activity: Not on file  Stress: Not on file  Social Connections: Not on file  Intimate Partner Violence: Not on file    Outpatient Medications Prior to Visit  Medication Sig Dispense Refill   budesonide (RHINOCORT AQUA) 32 MCG/ACT nasal spray Place 2 sprays into both nostrils daily.     Cholecalciferol (VITAMIN D3) 125 MCG (5000 UT) TABS Take by mouth.     EPINEPHrine 0.3 mg/0.3 mL IJ SOAJ injection SMARTSIG:Injection IM As Directed     ipratropium (ATROVENT) 0.06 % nasal spray Place into both nostrils.     levocetirizine (XYZAL) 5 MG tablet every evening.  5   loratadine (CLARITIN) 10 MG tablet Take by mouth.     montelukast (SINGULAIR) 10 MG tablet Take 10 mg by mouth daily.  5   naproxen sodium (ANAPROX DS) 550 MG tablet Take 1 tablet  (550 mg total) by mouth 2 (two) times daily with a meal. Prn sx 30 tablet 2   sertraline (ZOLOFT) 50 MG tablet TAKE 1 TABLET BY MOUTH EVERY DAY 90 tablet 1   Elagolix Sodium (ORILISSA) 150 MG TABS Take 150 mg by mouth daily. 30 tablet 2   No facility-administered medications prior to visit.      ROS:  Review of Systems  Constitutional:  Negative for fever.  Gastrointestinal:  Negative for blood in stool, constipation, diarrhea, nausea and vomiting.  Genitourinary:  Negative for dyspareunia, dysuria, flank pain, frequency, hematuria, urgency, vaginal bleeding, vaginal discharge and vaginal pain.  Musculoskeletal:  Negative for back pain.  Skin:  Negative for rash.  BREAST: No symptoms   OBJECTIVE:   Vitals:  BP 98/70   Ht 5\' 5"  (1.651 m)   Wt 136 lb (61.7 kg)   BMI 22.63 kg/m   Physical Exam Vitals reviewed.  Constitutional:      Appearance: She is well-developed.  Pulmonary:     Effort: Pulmonary effort is normal.  Musculoskeletal:        General: Normal range of motion.     Cervical back: Normal range of motion.  Skin:    General: Skin is warm and dry.  Neurological:     General: No focal deficit present.     Mental Status: She is alert and oriented to person, place, and time.     Cranial Nerves: No cranial nerve deficit.  Psychiatric:        Mood and Affect: Mood normal.        Behavior: Behavior normal.        Thought Content: Thought content normal.        Judgment: Judgment normal.    Assessment/Plan: Endometriosis - Plan: Elagolix Sodium (ORILISSA) 150 MG TABS; doing well so far with with sx improvement. Wants to cont, Rx eRxd. F/u at 1/23 annual.   Encounter for breast cancer screening using non-mammogram modality - Plan: MR BREAST BILATERAL W WO CONTRAST INC CAD; breast MRI ref placed. Will f /u with results.   Family history of breast cancer - Plan: MR BREAST BILATERAL W WO CONTRAST INC CAD  Increased risk of breast cancer - Plan: MR BREAST  BILATERAL W WO CONTRAST INC CAD    Meds ordered this encounter  Medications   Elagolix Sodium (ORILISSA) 150 MG TABS    Sig: Take 150 mg by mouth daily.    Dispense:  30 tablet    Refill:  4    Order Specific Question:   Supervising Provider    Answer:  Gae Dry [[id number]]      No follow-ups on file.  Geraldene Eisel B. Pritika Alvarez, PA-C 02/15/2021 10:37 AM

## 2021-02-15 ENCOUNTER — Encounter: Payer: Self-pay | Admitting: Obstetrics and Gynecology

## 2021-02-15 ENCOUNTER — Other Ambulatory Visit: Payer: Self-pay

## 2021-02-15 ENCOUNTER — Ambulatory Visit (INDEPENDENT_AMBULATORY_CARE_PROVIDER_SITE_OTHER): Payer: 59 | Admitting: Obstetrics and Gynecology

## 2021-02-15 VITALS — BP 98/70 | Ht 65.0 in | Wt 136.0 lb

## 2021-02-15 DIAGNOSIS — Z803 Family history of malignant neoplasm of breast: Secondary | ICD-10-CM

## 2021-02-15 DIAGNOSIS — Z9189 Other specified personal risk factors, not elsewhere classified: Secondary | ICD-10-CM

## 2021-02-15 DIAGNOSIS — Z1239 Encounter for other screening for malignant neoplasm of breast: Secondary | ICD-10-CM | POA: Diagnosis not present

## 2021-02-15 DIAGNOSIS — N809 Endometriosis, unspecified: Secondary | ICD-10-CM | POA: Diagnosis not present

## 2021-02-15 MED ORDER — ORILISSA 150 MG PO TABS: 150.0000 mg | ORAL_TABLET | Freq: Every day | ORAL | 4 refills | Status: DC

## 2021-02-28 ENCOUNTER — Telehealth: Payer: Self-pay

## 2021-02-28 NOTE — Telephone Encounter (Signed)
Pt calling; due to her ins changing Dewayne Hatch needs a PA done.  [phone]

## 2021-03-02 NOTE — Telephone Encounter (Signed)
Pt aware PA was approved and approval letter has been faxed to her pharm.

## 2021-03-02 NOTE — Telephone Encounter (Signed)
Pt calling to f/u on her call from earlier this week regarding PA on orilissa; hasn't heard anything.  [phone] No answer.

## 2021-03-26 ENCOUNTER — Other Ambulatory Visit: Payer: Self-pay

## 2021-03-26 ENCOUNTER — Ambulatory Visit
Admission: RE | Admit: 2021-03-26 | Discharge: 2021-03-26 | Disposition: A | Discharge status: Home / Self Care | Payer: 59 | Source: Ambulatory Visit | Attending: Obstetrics and Gynecology | Admitting: Obstetrics and Gynecology

## 2021-03-26 DIAGNOSIS — Z1239 Encounter for other screening for malignant neoplasm of breast: Secondary | ICD-10-CM | POA: Diagnosis not present

## 2021-03-26 DIAGNOSIS — Z9189 Other specified personal risk factors, not elsewhere classified: Secondary | ICD-10-CM | POA: Diagnosis present

## 2021-03-26 DIAGNOSIS — Z803 Family history of malignant neoplasm of breast: Secondary | ICD-10-CM

## 2021-03-26 IMAGING — MR MR BREAST BILAT WO/W CM
2 of 9 series · 6 of 48 positions shown · IV contrast (6ml Gadavist)
Comparison: Previous exam(s).

CLINICAL DATA: High risk breast cancer screening study. Patient has
a strong family history of breast cancer.

LABS:  None obtained at the time of imaging.
EXAM:
BILATERAL BREAST MRI WITH AND WITHOUT CONTRAST
TECHNIQUE: Multiplanar, multisequence MR images of both breasts were obtained
prior to and following the intravenous administration of 6 ml of
Gadavist

[Series 2: T1 · axial · B · 1.5mm · 1.02mm/px · z∈[-76,+90]mm · 5 of 112 slices shown]
[im 1/112]
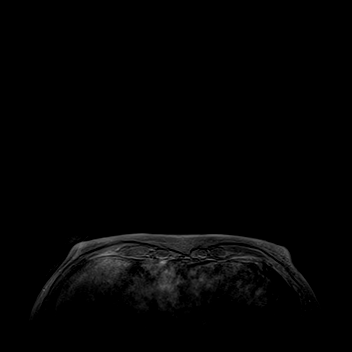
[im 28/112]
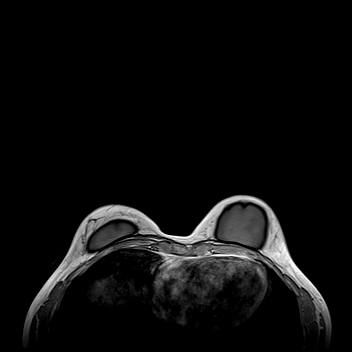
[im 56/112]
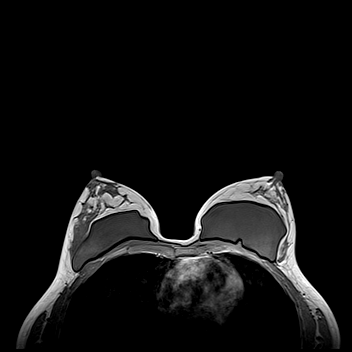
[im 84/112]
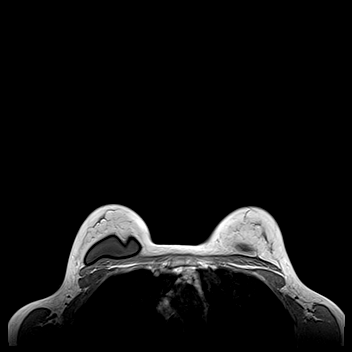
[im 112/112]
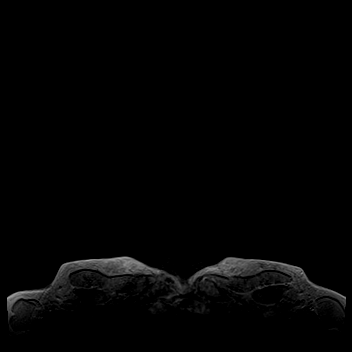

[Series 3: T2 · axial · B · 3.0mm · 1.02mm/px · 1 of 46 slices shown]
[im 1/46]
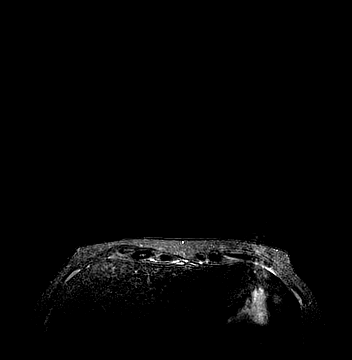

[6 of 48 positions shown; findings below may reference images not displayed]

Three-dimensional MR images were rendered by post-processing of the
original MR data on an independent workstation. The
three-dimensional MR images were interpreted, and findings are
reported in the following complete MRI report for this study. Three
dimensional images were evaluated at the independent interpreting
workstation using the DynaCAD thin client.
FINDINGS: Breast composition: c. Heterogeneous fibroglandular tissue.

Background parenchymal enhancement: Moderate.

The patient has bilateral retropectoral implants.

Right breast: No mass or abnormal enhancement.

Left breast: No mass or abnormal enhancement.

Lymph nodes: No abnormal appearing lymph nodes.

Ancillary findings:  None.
IMPRESSION: No abnormal enhancement in either breast.

RECOMMENDATION:
Bilateral screening mammogram in [DATE] is recommended.

The American Cancer Society recommends annual MRI and mammography in
patients with an estimated lifetime risk of developing breast cancer
greater than 20 - 25%, or who are known or suspected to be positive
for the breast cancer gene.

BI-RADS CATEGORY  1: Negative.

## 2021-03-26 MED ORDER — GADOBUTROL 1 MMOL/ML IV SOLN
6.0000 mL | Freq: Once | INTRAVENOUS | Status: AC | PRN
  Administered 2021-03-26: 6 mL via INTRAVENOUS

## 2021-04-02 ENCOUNTER — Ambulatory Visit: Payer: Self-pay

## 2021-07-17 NOTE — Progress Notes (Signed)
BP (!) 82/53    Pulse 62    Temp 98.2 F (36.8 C) (Oral)    Ht 5' 6.1" (1.679 m)    Wt 137 lb 12.8 oz (62.5 kg)    LMP 05/26/2021 (Approximate)    SpO2 98%    BMI 22.17 kg/m    Subjective:    Patient ID: Veronica Chan, female    DOB: 01-06-1986, 36 y.o.   MRN: [id number]  HPI: Veronica Chan is a 36 y.o. female  Chief Complaint  Patient presents with   New Patient (Initial Visit)   Establish Care    Pt states she would like to discuss her back issues. States she has had back pain for years now.    Patient presents to clinic to establish care with new PCP.  Introduced to Publishing rights manager role and practice setting.  All questions answered.  Discussed provider/patient relationship and expectations.  Patient reports a history of Anxiety and constipation.  Patient denies a history of: Hypertension, Elevated Cholesterol, Diabetes, Thyroid problems, Depression, Neurological problems, and Abdominal problems.    BACK PAIN- has been going on for years. Feels like it is uncomfortable to drive in her car. Does yoga and exercises but that does not improve the symptoms. Duration: years Mechanism of injury: no trauma Location: Left and upper back Onset: gradual Severity: moderate Quality: sharp and aching Frequency: constant Radiation: none Aggravating factors: prolonged sitting Alleviating factors: laying Status: worse Treatments attempted:  Yoga and Aspirin and tylenol   Relief with NSAIDs?: mild Nighttime pain:  no Paresthesias / decreased sensation:  no Bowel / bladder incontinence:  no Fevers:  no Dysuria / urinary frequency:  no  Active Ambulatory Problems    Diagnosis Date Noted   Low grade squamous intraepith lesion on cytologic smear cervix (lgsil) 06/09/2018   Family history of breast cancer 06/09/2018   Increased risk of breast cancer 06/09/2018   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   PMDD (premenstrual dysphoric disorder) 03/28/2020   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 05/22/2020   Sleep disturbance 05/22/2020   Endometriosis 02/15/2021   Chronic left-sided thoracic back pain 07/18/2021   Resolved Ambulatory Problems    Diagnosis Date Noted   No Resolved Ambulatory Problems   Past Medical History:  Diagnosis Date   Acne    BRCA negative    Chronic constipation    History of Papanicolaou smear of cervix 04/02/2013; 05/17/2014   Idiopathic hypersomnia    Lyme disease    Narcolepsy    Past Surgical History:  Procedure Laterality Date   AUGMENTATION MAMMAPLASTY Bilateral 2017   COLONOSCOPY WITH PROPOFOL N/A 04/23/2016   Procedure: COLONOSCOPY WITH PROPOFOL;  Surgeon: Christena Deem, MD;  Location: [facility] ENDOSCOPY;  Service: Endoscopy;  Laterality: N/A;   DIAGNOSTIC LAPAROSCOPY  2010   DR. SCHERMERHORN - ENDOMETRIOSIS   ENDOMETRIAL BIOPSY     FOOT SURGERY Right    Family History  Problem Relation Age of Onset   Breast cancer Mother 70       TAH/BSO BEFORE BR CA, IS MyRisk NEG   Colon polyps Mother    Thyroid cancer Mother    Lung cancer Father    Breast cancer Maternal Grandmother 47   Lung cancer Paternal Grandmother    COPD Paternal Grandfather      Review of Systems  Musculoskeletal:  Positive for back pain.   Per HPI unless specifically indicated above     Objective:    BP (!) 82/53  Pulse 62    Temp 98.2 F (36.8 C) (Oral)    Ht 5' 6.1" (1.679 m)    Wt 137 lb 12.8 oz (62.5 kg)    LMP 05/26/2021 (Approximate)    SpO2 98%    BMI 22.17 kg/m   Wt Readings from Last 3 Encounters:  07/18/21 137 lb 12.8 oz (62.5 kg)  02/15/21 136 lb (61.7 kg)  08/09/20 136 lb (61.7 kg)    Physical Exam Vitals and nursing note reviewed.  Constitutional:      General: She is not in acute distress.    Appearance: Normal appearance. She is normal weight. She is not ill-appearing, toxic-appearing or diaphoretic.  HENT:     Head: Normocephalic.     Right Ear: External ear normal.     Left Ear: External ear normal.     Nose:  Nose normal.     Mouth/Throat:     Mouth: Mucous membranes are moist.     Pharynx: Oropharynx is clear.  Eyes:     General:        Right eye: No discharge.        Left eye: No discharge.     Extraocular Movements: Extraocular movements intact.     Conjunctiva/sclera: Conjunctivae normal.     Pupils: Pupils are equal, round, and reactive to light.  Cardiovascular:     Rate and Rhythm: Normal rate and regular rhythm.     Heart sounds: No murmur heard. Pulmonary:     Effort: Pulmonary effort is normal. No respiratory distress.     Breath sounds: Normal breath sounds. No wheezing or rales.  Musculoskeletal:     Cervical back: Normal range of motion and neck supple.     Thoracic back: Normal.  Skin:    General: Skin is warm and dry.     Capillary Refill: Capillary refill takes less than 2 seconds.  Neurological:     General: No focal deficit present.     Mental Status: She is alert and oriented to person, place, and time. Mental status is at baseline.  Psychiatric:        Mood and Affect: Mood normal.        Behavior: Behavior normal.        Thought Content: Thought content normal.        Judgment: Judgment normal.    Results for orders placed or performed in visit on 08/09/20  Cytology - PAP  Result Value Ref Range   High risk HPV Positive (A)    Adequacy      Satisfactory for evaluation; transformation zone component ABSENT.   Diagnosis      - Negative for intraepithelial lesion or malignancy (NILM)   Comment Normal Reference Range HPV - Negative       Assessment & Plan:   Problem List Items Addressed This Visit       Other   Chronic left-sided thoracic back pain - Primary    Chronic. Patient has had thoracic back pain for several years.  Feels like it is getting worse. Had a Dexa scan at some point and felt like there was a curvature.  I am not able to view those results. Will order thoracic spine xray for evaluation. If unable to determine the cause of back pain will  place referral for patient to see Ortho.        Relevant Orders   DG Thoracic Spine 2 View   Other Visit Diagnoses     Encounter to  establish care            Follow up plan: Return in about 3 months (around 10/16/2021) for Physical and Fasting labs.

## 2021-07-18 ENCOUNTER — Ambulatory Visit
Admission: RE | Admit: 2021-07-18 | Discharge: 2021-07-18 | Disposition: A | Discharge status: Home / Self Care | Payer: 59 | Attending: Nurse Practitioner | Admitting: Nurse Practitioner

## 2021-07-18 ENCOUNTER — Ambulatory Visit (INDEPENDENT_AMBULATORY_CARE_PROVIDER_SITE_OTHER): Payer: Self-pay | Admitting: Nurse Practitioner

## 2021-07-18 ENCOUNTER — Other Ambulatory Visit: Payer: Self-pay

## 2021-07-18 ENCOUNTER — Ambulatory Visit
Admission: RE | Admit: 2021-07-18 | Discharge: 2021-07-18 | Disposition: A | Discharge status: Home / Self Care | Payer: 59 | Source: Ambulatory Visit | Attending: Nurse Practitioner | Admitting: Nurse Practitioner

## 2021-07-18 ENCOUNTER — Encounter: Payer: Self-pay | Admitting: Nurse Practitioner

## 2021-07-18 VITALS — BP 82/53 | HR 62 | Temp 98.2°F | Ht 66.1 in | Wt 137.8 lb

## 2021-07-18 DIAGNOSIS — M546 Pain in thoracic spine: Secondary | ICD-10-CM

## 2021-07-18 DIAGNOSIS — G8929 Other chronic pain: Secondary | ICD-10-CM

## 2021-07-18 DIAGNOSIS — Z7689 Persons encountering health services in other specified circumstances: Secondary | ICD-10-CM

## 2021-07-18 IMAGING — DX DG THORACIC SPINE 3V
3 series · 3 of 3 positions shown · non-contrast
Comparison: None.

CLINICAL DATA: Back pain

EXAM:
THORACIC SPINE - 3 VIEWS

[t-spine ap]
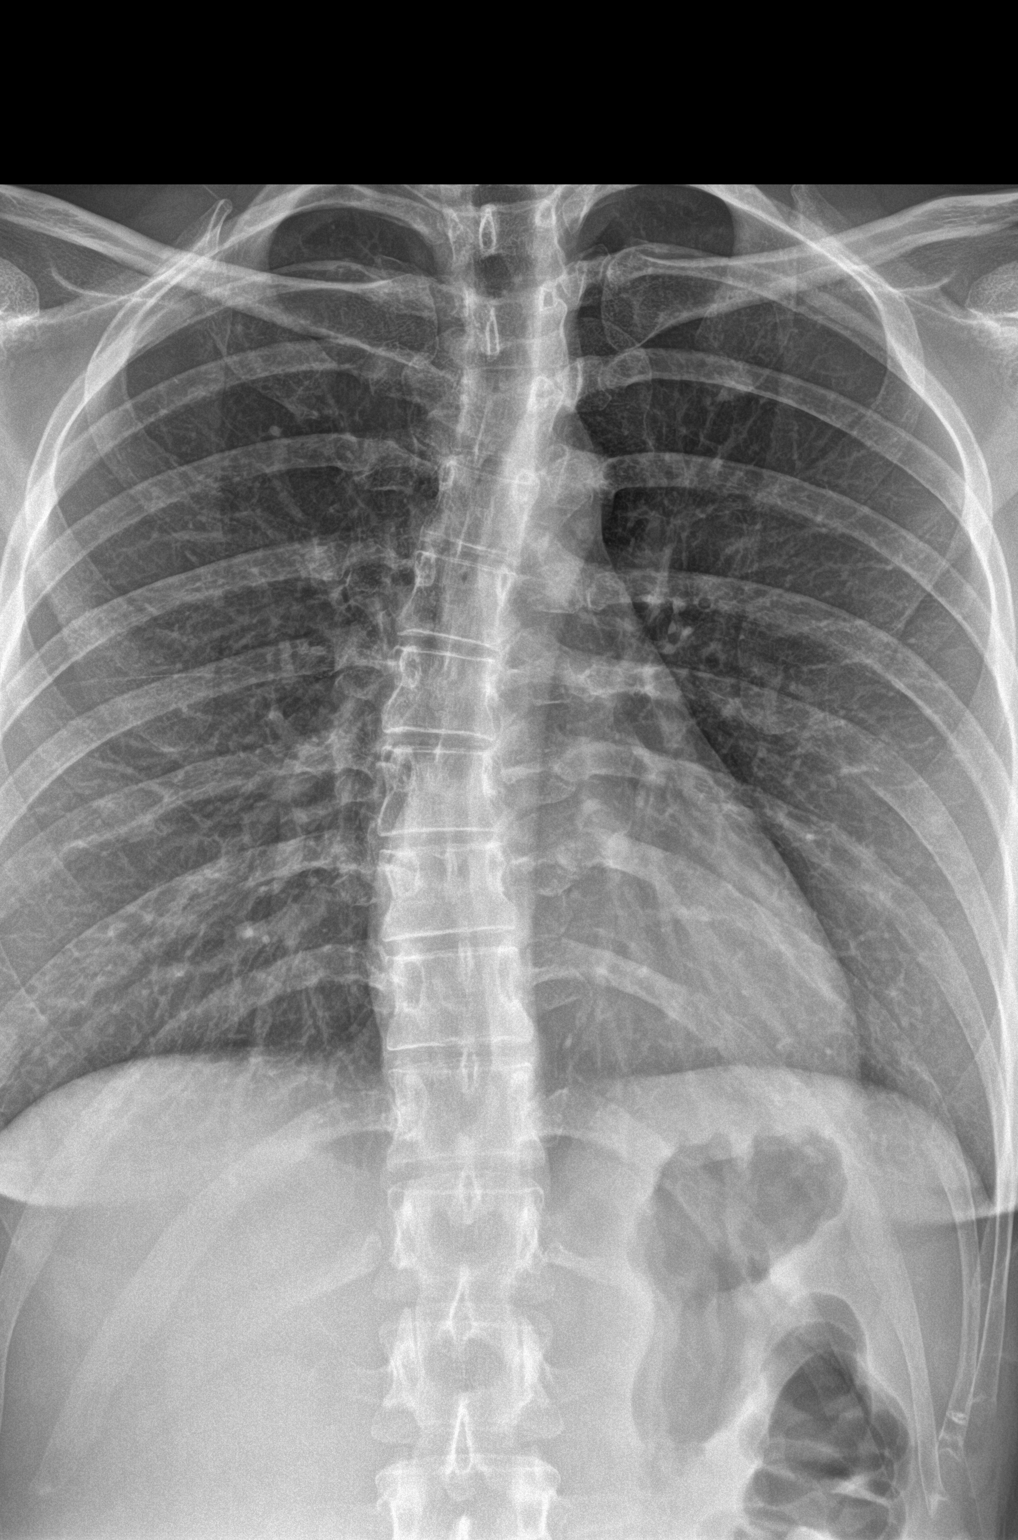

[t-spine lat]
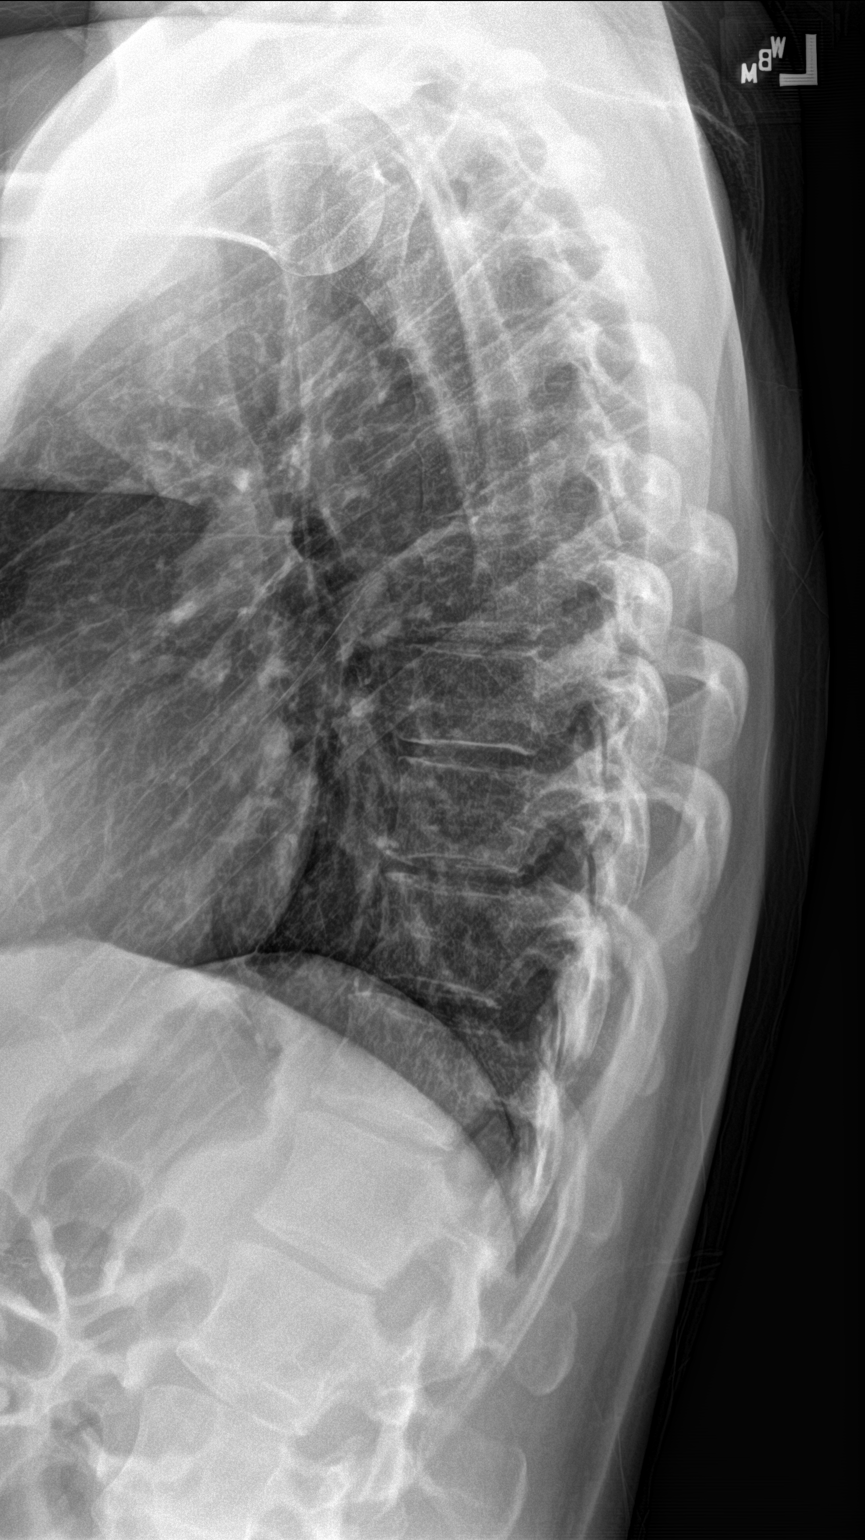

[t-spine swimmers]
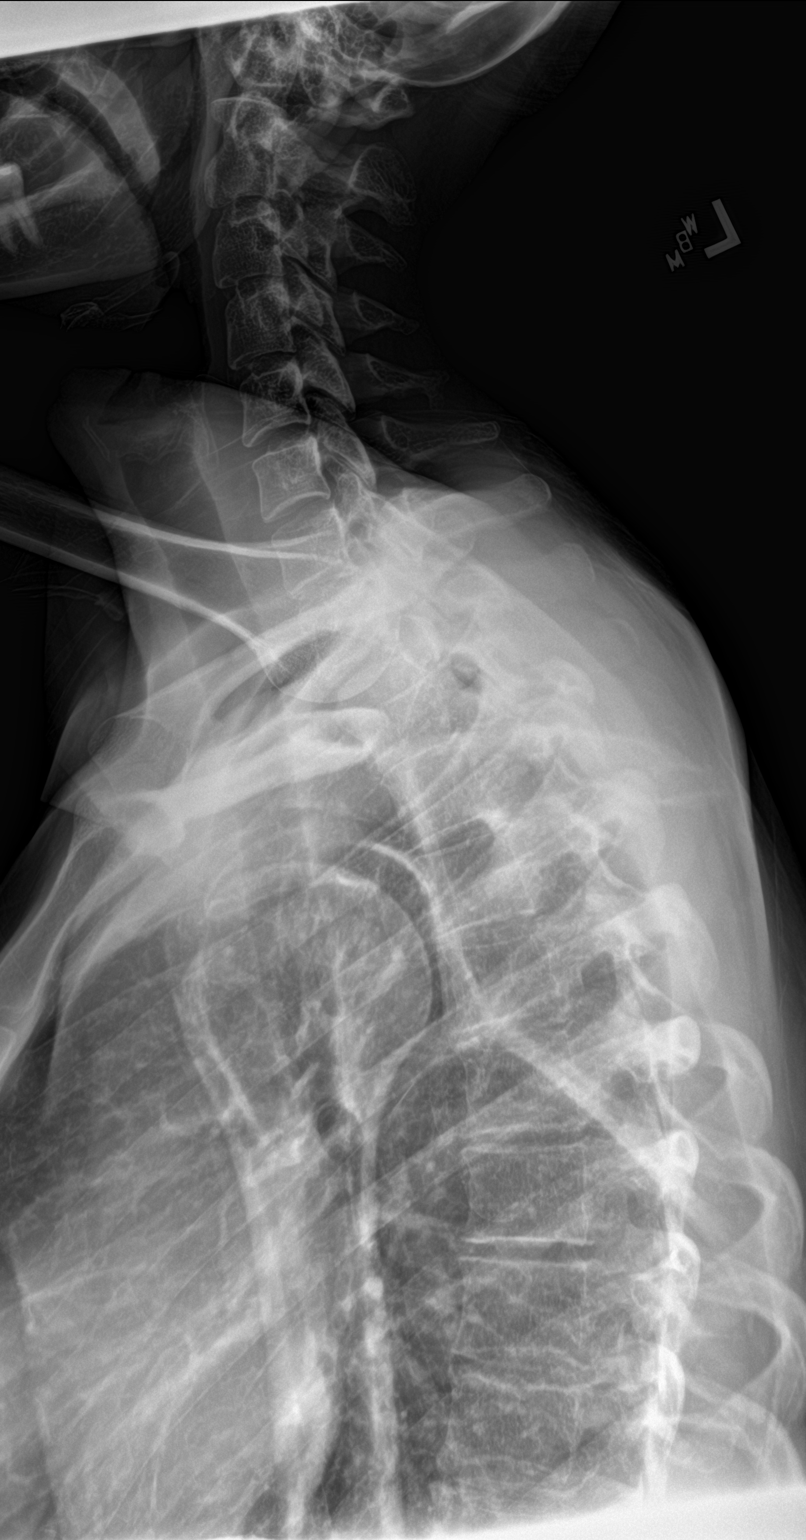

[3 of 3 positions shown; findings below may reference images not displayed]

FINDINGS: No recent fracture is seen. Alignment of posterior margins of
vertebral bodies is unremarkable. There is mild dextroscoliosis in
the thoracic spine. Paraspinal soft tissues are unremarkable.
IMPRESSION: No recent fracture is seen.  Dextroscoliosis.

## 2021-07-18 NOTE — Progress Notes (Signed)
Hi Deshante. It was nice to meet you today.  Your xray does show that have you have Dextroscoliosis.  I think it would be worth while to see Orthopedics for further evaluation.  I believe this is the source of your pain. I have placed the referral.

## 2021-07-18 NOTE — Addendum Note (Signed)
Addended by: Larae Grooms on: 07/18/2021 01:17 PM   Modules accepted: Orders

## 2021-07-18 NOTE — Assessment & Plan Note (Signed)
Chronic. Patient has had thoracic back pain for several years.  Feels like it is getting worse. Had a Dexa scan at some point and felt like there was a curvature.  I am not able to view those results. Will order thoracic spine xray for evaluation. If unable to determine the cause of back pain will place referral for patient to see Ortho.

## 2021-08-13 ENCOUNTER — Encounter: Payer: Self-pay | Admitting: Nurse Practitioner

## 2021-08-13 ENCOUNTER — Ambulatory Visit: Payer: 59 | Admitting: Obstetrics and Gynecology

## 2021-08-13 ENCOUNTER — Telehealth (INDEPENDENT_AMBULATORY_CARE_PROVIDER_SITE_OTHER): Payer: Self-pay | Admitting: Nurse Practitioner

## 2021-08-13 DIAGNOSIS — J0110 Acute frontal sinusitis, unspecified: Secondary | ICD-10-CM

## 2021-08-13 MED ORDER — AMOXICILLIN-POT CLAVULANATE 875-125 MG PO TABS: 1.0000 | ORAL_TABLET | Freq: Two times a day (BID) | ORAL | 0 refills | Status: AC

## 2021-08-13 NOTE — Progress Notes (Signed)
There were no vitals taken for this visit.   Subjective:    Patient ID: Veronica Chan, female    DOB: 1985/08/16, 36 y.o.   MRN: [id number]  HPI: Veronica Chan is a 36 y.o. female  Chief Complaint  Patient presents with   Sinus pressure    Going on for the past 2 weeks, fatigue, nasal congestion, sneezing.    UPPER RESPIRATORY TRACT INFECTION Worst symptom: symptoms have been going on for about 2 weeks Fever: no Cough: yes Shortness of breath: no Wheezing: no Chest pain: no Chest tightness: no Chest congestion: no Nasal congestion: yes Runny nose: yes Post nasal drip: yes Sneezing: yes Sore throat: no Swollen glands: no Sinus pressure: yes Headache: yes Face pain: yes Toothache: no Ear pain: no bilateral Ear pressure: no bilateral Eyes red/itching:no Eye drainage/crusting:  watery   Vomiting: no Rash: no Fatigue: yes Sick contacts: no Strep contacts: no  Context: better Recurrent sinusitis: no Relief with OTC cold/cough medications: no  Treatments attempted:  Xyzal and Singulair, sudafed    Relevant past medical, surgical, family and social history reviewed and updated as indicated. Interim medical history since our last visit reviewed. Allergies and medications reviewed and updated.  Review of Systems  Constitutional:  Positive for fatigue. Negative for fever.  HENT:  Positive for sinus pressure, sinus pain and sneezing. Negative for congestion, dental problem, ear pain, postnasal drip, rhinorrhea and sore throat.   Respiratory:  Positive for cough. Negative for shortness of breath and wheezing.   Cardiovascular:  Negative for chest pain.  Gastrointestinal:  Negative for vomiting.  Skin:  Negative for rash.  Neurological:  Positive for headaches.   Per HPI unless specifically indicated above     Objective:    There were no vitals taken for this visit.  Wt Readings from Last 3 Encounters:  07/18/21 137 lb 12.8 oz (62.5 kg)  02/15/21 136  lb (61.7 kg)  08/09/20 136 lb (61.7 kg)    Physical Exam Vitals and nursing note reviewed.  HENT:     Head: Normocephalic.     Right Ear: Hearing normal.     Left Ear: Hearing normal.     Nose: Nose normal.  Eyes:     Pupils: Pupils are equal, round, and reactive to light.  Pulmonary:     Effort: Pulmonary effort is normal. No respiratory distress.  Neurological:     Mental Status: She is alert.  Psychiatric:        Mood and Affect: Mood normal.        Behavior: Behavior normal.        Thought Content: Thought content normal.        Judgment: Judgment normal.    Results for orders placed or performed in visit on 08/09/20  Cytology - PAP  Result Value Ref Range   High risk HPV Positive (A)    Adequacy      Satisfactory for evaluation; transformation zone component ABSENT.   Diagnosis      - Negative for intraepithelial lesion or malignancy (NILM)   Comment Normal Reference Range HPV - Negative       Assessment & Plan:   Problem List Items Addressed This Visit   None Visit Diagnoses     Acute non-recurrent frontal sinusitis    -  Primary   Augmentin sent to the pharmacy.  Complete course of antibiotics. If symptoms do not improve return to clinic for reevaluation.    Relevant Medications  amoxicillin-clavulanate (AUGMENTIN) 875-125 MG tablet        Follow up plan: Return if symptoms worsen or fail to improve.   This visit was completed via MyChart due to the restrictions of the COVID-19 pandemic. All issues as above were discussed and addressed. Physical exam was done as above through visual confirmation on MyChart. If it was felt that the patient should be evaluated in the office, they were directed there. The patient verbally consented to this visit. Location of the patient: Home Location of the provider: Office Those involved with this call:  Provider: Larae Grooms, NP CMA: Tristan Schroeder, CMA Front Desk/Registration: Channing Mutters This encounter was  conducted via video.  I spent 15 dedicated to the care of this patient on the date of this encounter to include previsit review of 20, face to face time with the patient, and post visit ordering of testing.

## 2021-08-20 ENCOUNTER — Other Ambulatory Visit: Payer: Self-pay | Admitting: Obstetrics and Gynecology

## 2021-08-20 DIAGNOSIS — N809 Endometriosis, unspecified: Secondary | ICD-10-CM

## 2021-08-27 ENCOUNTER — Other Ambulatory Visit (HOSPITAL_COMMUNITY)
Admission: RE | Admit: 2021-08-27 | Discharge: 2021-08-27 | Disposition: A | Discharge status: Home / Self Care | Payer: 59 | Source: Ambulatory Visit | Attending: Obstetrics and Gynecology | Admitting: Obstetrics and Gynecology

## 2021-08-27 ENCOUNTER — Ambulatory Visit (INDEPENDENT_AMBULATORY_CARE_PROVIDER_SITE_OTHER): Payer: 59 | Admitting: Obstetrics and Gynecology

## 2021-08-27 ENCOUNTER — Other Ambulatory Visit: Payer: Self-pay

## 2021-08-27 ENCOUNTER — Encounter: Payer: Self-pay | Admitting: Obstetrics and Gynecology

## 2021-08-27 VITALS — BP 110/70 | Ht 65.0 in | Wt 134.0 lb

## 2021-08-27 DIAGNOSIS — Z124 Encounter for screening for malignant neoplasm of cervix: Secondary | ICD-10-CM | POA: Diagnosis not present

## 2021-08-27 DIAGNOSIS — F3281 Premenstrual dysphoric disorder: Secondary | ICD-10-CM

## 2021-08-27 DIAGNOSIS — Z803 Family history of malignant neoplasm of breast: Secondary | ICD-10-CM

## 2021-08-27 DIAGNOSIS — N809 Endometriosis, unspecified: Secondary | ICD-10-CM | POA: Diagnosis not present

## 2021-08-27 DIAGNOSIS — Z1151 Encounter for screening for human papillomavirus (HPV): Secondary | ICD-10-CM | POA: Diagnosis present

## 2021-08-27 DIAGNOSIS — R87810 Cervical high risk human papillomavirus (HPV) DNA test positive: Secondary | ICD-10-CM

## 2021-08-27 DIAGNOSIS — N951 Menopausal and female climacteric states: Secondary | ICD-10-CM

## 2021-08-27 DIAGNOSIS — Z9189 Other specified personal risk factors, not elsewhere classified: Secondary | ICD-10-CM | POA: Diagnosis not present

## 2021-08-27 DIAGNOSIS — Z01419 Encounter for gynecological examination (general) (routine) without abnormal findings: Secondary | ICD-10-CM | POA: Diagnosis not present

## 2021-08-27 DIAGNOSIS — Z1231 Encounter for screening mammogram for malignant neoplasm of breast: Secondary | ICD-10-CM | POA: Diagnosis not present

## 2021-08-27 DIAGNOSIS — R69 Illness, unspecified: Secondary | ICD-10-CM | POA: Diagnosis not present

## 2021-08-27 MED ORDER — ORILISSA 150 MG PO TABS: 150.0000 mg | ORAL_TABLET | Freq: Every day | ORAL | 11 refills | Status: DC

## 2021-08-27 MED ORDER — NORETHINDRONE ACETATE 5 MG PO TABS: 5.0000 mg | ORAL_TABLET | Freq: Every day | ORAL | 3 refills | Status: DC

## 2021-08-27 MED ORDER — SERTRALINE HCL 50 MG PO TABS: ORAL_TABLET | ORAL | 1 refills | Status: DC

## 2021-08-27 NOTE — Patient Instructions (Signed)
I value your feedback and you entrusting us with your care. If you get a [facility] patient survey, I would appreciate you taking the time to let us know about your experience today. Thank you!  [facility]: [phone]      

## 2021-08-27 NOTE — Progress Notes (Signed)
Chief Complaint  Patient presents with   Gynecologic Exam    Has qs about Orilissa, hot flashes     HPI:      [name] is a 36 y.o. G0P0000 who LMP was Patient's last menstrual period was 07/20/2021 (approximate)., presents today for her annual examination.  Her menses are Q40-45 days now on orilissa, lasting 6 days; was amenorrheic last yr with [place]. Dysmen/pelvic pain improved. Pt with vasomotor sx now. Also still has PMDD sx for 2 wks before bleeding starts, not improved with sertraline 50 mg daily. Pt starts sertraline with sx start, not before. Also having occas panic attacks now, unsure if during PMDD sx or other times. FH anxiety/panic attacks in her mom; new for pt.   She has a hx of endometriosis (diagnosed on dx lap 2010 at [facility]) and had pressure/pain 1 1/2 weeks before and during her period. Took midol, tylenol, heating, lies down with some relief. Had 2 really bad days with menses. Not missing work but able to work from home anyway and lies down. She did lupron for 6 months summer 2017 with significant sx improvement. Pt doesn't like the way she feels on mult OCPs, so never started f/u med.   Sex activity: currently sexually active with decreased dyspareunia. Using condoms. Declines BC for now. Partner to get vasectomy. Has vaginal dryness, improved with lubricants.   Last Pap: 08/09/20 Results were no abnormalities/POS HPV DNA. Repeat pap due today. 01/26/20 NILM 08/12/19 CIN 1 on colpo/bx with Dr. Tiburcio Pea. 06/14/19  NILM/POS HPV DNA 05/20/18  Results were no abnormalities/ POS HPV DNA.   12/24/16 with LGSIL/pos HPV DNA; Colpo done 01/28/17 with Dr. Tiburcio Pea with CIN1 on bx  Hx of STDs: HPV  Last mammogram: 10/02/20  Results were normal, repeat in 1 yr due to FH/increased risk of breast cancer. Had neg breast MRI 03/26/21 and 8/21.  There is a FH of breast cancer in her mom and MGM. There is no FH of ovarian cancer.   Pt is BRCA neg and her mom was My Risk neg. Pt's  IBIS=34%. She takes Vit D supp. The patient does self-breast exams.  Tobacco use: The patient denies current or previous tobacco use. Alcohol use: rare No drug use Exercise: moderately active  She does get adequate calcium and Vitamin D in her diet.   Past Medical History:  Diagnosis Date   Acne    BRCA negative    Chronic constipation    Endometriosis    Family history of breast cancer 03/2012   Pt's IBIS=34.2%10/15/  Pt's mom is MyRisk neg.   History of Papanicolaou smear of cervix 04/02/2013; 05/17/2014   neg; neg   Idiopathic hypersomnia    Lyme disease    Narcolepsy     Past Surgical History:  Procedure Laterality Date   AUGMENTATION MAMMAPLASTY Bilateral 2017   COLONOSCOPY WITH PROPOFOL N/A 04/23/2016   Procedure: COLONOSCOPY WITH PROPOFOL;  Surgeon: Christena Deem, MD;  Location: [facility] ENDOSCOPY;  Service: Endoscopy;  Laterality: N/A;   DIAGNOSTIC LAPAROSCOPY  2010   DR. SCHERMERHORN - ENDOMETRIOSIS   ENDOMETRIAL BIOPSY     FOOT SURGERY Right     Family History  Problem Relation Age of Onset   Breast cancer Mother 56       TAH/BSO BEFORE BR CA, IS MyRisk NEG   Colon polyps Mother    Thyroid cancer Mother    Lung cancer Father    Breast cancer Maternal Grandmother 66  Lung cancer Paternal Grandmother    COPD Paternal Grandfather     Social History   Socioeconomic History   Marital status: Single    Spouse name: Not on file   Number of children: Not on file   Years of education: 14   Highest education level: Not on file  Occupational History    Employer: [employer]  Tobacco Use   Smoking status: Former    Types: Cigarettes    Quit date: 2015    Years since quitting: 8.1   Smokeless tobacco: Former  Scientific laboratory technician Use: Every day  Substance and Sexual Activity   Alcohol use: Yes    Comment: rarely   Drug use: No   Sexual activity: Yes    Birth control/protection: None, Condom  Other Topics Concern   Not on file  Social History  Narrative   Not on file   Social Determinants of Health   Financial Resource Strain: Not on file  Food Insecurity: Not on file  Transportation Needs: Not on file  Physical Activity: Not on file  Stress: Not on file  Social Connections: Not on file  Intimate Partner Violence: Not on file     Current Outpatient Medications:    budesonide (RHINOCORT AQUA) 32 MCG/ACT nasal spray, Place 2 sprays into both nostrils daily., Disp: , Rfl:    Cholecalciferol (VITAMIN D3) 125 MCG (5000 UT) TABS, Take 1 tablet by mouth daily., Disp: , Rfl:    EPINEPHrine 0.3 mg/0.3 mL IJ SOAJ injection, SMARTSIG:Injection IM As Directed, Disp: , Rfl:    levocetirizine (XYZAL) 5 MG tablet, Take 5 mg by mouth every evening., Disp: , Rfl: 5   loratadine (CLARITIN) 10 MG tablet, Take 10 mg by mouth daily., Disp: , Rfl:    montelukast (SINGULAIR) 10 MG tablet, Take 10 mg by mouth daily., Disp: , Rfl: 5   naproxen sodium (ANAPROX DS) 550 MG tablet, Take 1 tablet (550 mg total) by mouth 2 (two) times daily with a meal. Prn sx, Disp: 30 tablet, Rfl: 2   norethindrone (AYGESTIN) 5 MG tablet, Take 1 tablet (5 mg total) by mouth daily., Disp: 90 tablet, Rfl: 3   SUNOSI 150 MG TABS, Take 1 tablet by mouth daily., Disp: , Rfl:    Elagolix Sodium (ORILISSA) 150 MG TABS, Take 150 mg by mouth daily., Disp: 30 tablet, Rfl: 11   sertraline (ZOLOFT) 50 MG tablet, TAKE 1 TABLET BY MOUTH EVERY DAY, Disp: 90 tablet, Rfl: 1  ROS:  Review of Systems  Constitutional:  Positive for fatigue. Negative for fever and unexpected weight change.  Respiratory:  Negative for cough, shortness of breath and wheezing.   Cardiovascular:  Negative for chest pain, palpitations and leg swelling.  Gastrointestinal:  Positive for constipation. Negative for blood in stool, diarrhea, nausea and vomiting.  Endocrine: Negative for cold intolerance, heat intolerance and polyuria.  Genitourinary:  Negative for dyspareunia, dysuria, flank pain, frequency,  genital sores, hematuria, menstrual problem, pelvic pain, urgency, vaginal bleeding, vaginal discharge and vaginal pain.  Musculoskeletal:  Negative for back pain, joint swelling and myalgias.  Skin:  Negative for rash.  Neurological:  Negative for dizziness, syncope, light-headedness, numbness and headaches.  Hematological:  Negative for adenopathy.  Psychiatric/Behavioral:  Positive for agitation. Negative for confusion, sleep disturbance and suicidal ideas. The patient is not nervous/anxious.     Objective: BP 110/70    Ht $R'5\' 5"'IM$  (1.651 m)    Wt 134 lb (60.8 kg)    LMP  07/20/2021 (Approximate)    BMI 22.30 kg/m    Physical Exam Constitutional:      Appearance: She is well-developed.  Genitourinary:     Vulva normal.     Right Labia: No rash, tenderness or lesions.    Left Labia: No tenderness, lesions or rash.    No vaginal discharge, erythema or tenderness.      Right Adnexa: not tender and no mass present.    Left Adnexa: not tender and no mass present.    No cervical motion tenderness, friability or polyp.     Uterus is not enlarged or tender.  Breasts:    Right: No mass, nipple discharge, skin change or tenderness.     Left: No mass, nipple discharge, skin change or tenderness.  Neck:     Thyroid: No thyromegaly.  Cardiovascular:     Rate and Rhythm: Normal rate and regular rhythm.     Heart sounds: Normal heart sounds. No murmur heard. Pulmonary:     Effort: Pulmonary effort is normal.     Breath sounds: Normal breath sounds.  Abdominal:     Palpations: Abdomen is soft.     Tenderness: There is no abdominal tenderness. There is no guarding or rebound.  Musculoskeletal:        General: Normal range of motion.     Cervical back: Normal range of motion.  Lymphadenopathy:     Cervical: No cervical adenopathy.  Neurological:     General: No focal deficit present.     Mental Status: She is alert and oriented to person, place, and time.     Cranial Nerves: No cranial  nerve deficit.  Skin:    General: Skin is warm and dry.  Psychiatric:        Mood and Affect: Mood normal.        Behavior: Behavior normal.        Thought Content: Thought content normal.        Judgment: Judgment normal.  Vitals reviewed.    Assessment/Plan: Encounter for annual routine gynecological examination  Cervical cancer screening - Plan: Cytology - PAP  Screening for HPV (human papillomavirus) - Plan: Cytology - PAP  CIN I (cervical intraepithelial neoplasia I) - Plan: Cytology - PAP. Repeat today, will f/u with results.  Encounter for screening mammogram for malignant neoplasm of breast - Plan: MM 3D SCREEN BREAST BILATERAL; pt to sched mammo  Family history of breast cancer - Plan: MM 3D SCREEN BREAST BILATERAL; pt is BRCA neg/her mom is MyRisk neg  Increased risk of breast cancer - Plan: MM 3D SCREEN BREAST BILATERAL; pt aware of monthly SBE, yearly CBE and mammos, as well as scr breast MRI. Pt to do mammo and f/u if desires MRI ref. Cont Vit D supp  PMDD (premenstrual dysphoric disorder) - Plan: sertraline (ZOLOFT) 50 MG tablet; start 1 wk before sx. Rx RF.   Endometriosis - Plan: norethindrone (AYGESTIN) 5 MG tablet, Elagolix Sodium (ORILISSA) 150 MG TABS; cont orilissa. 2 yr mark is 4/24. Rx RF till annual next yr. Doing much better.   Perimenopausal vasomotor symptoms - Plan: norethindrone (AYGESTIN) 5 MG tablet; try aygestin daily for vasomotor sx. Cont ca/Vit D.   Meds ordered this encounter  Medications   norethindrone (AYGESTIN) 5 MG tablet    Sig: Take 1 tablet (5 mg total) by mouth daily.    Dispense:  90 tablet    Refill:  3    Order Specific Question:   Supervising Provider  Answer:   Gae Dry [[id number]]   Elagolix Sodium (ORILISSA) 150 MG TABS    Sig: Take 150 mg by mouth daily.    Dispense:  30 tablet    Refill:  11    Order Specific Question:   Supervising Provider    Answer:   Gae Dry [[id number]]   sertraline (ZOLOFT) 50 MG  tablet    Sig: TAKE 1 TABLET BY MOUTH EVERY DAY    Dispense:  90 tablet    Refill:  1    Order Specific Question:   Supervising Provider    Answer:   Gae Dry [[id number]]              GYN counsel breast self exam, mammography screening, adequate intake of calcium and vitamin D, diet and exercise     F/U  Return in about 1 year (around 08/27/2022).  Aseel Truxillo B. Shameek Nyquist, PA-C 08/27/2021 3:41 PM

## 2021-08-28 DIAGNOSIS — S29012A Strain of muscle and tendon of back wall of thorax, initial encounter: Secondary | ICD-10-CM | POA: Diagnosis not present

## 2021-08-28 DIAGNOSIS — M546 Pain in thoracic spine: Secondary | ICD-10-CM | POA: Diagnosis not present

## 2021-08-30 LAB — CYTOLOGY - PAP
Adequacy: ABSENT
Comment: NEGATIVE
Diagnosis: NEGATIVE
Diagnosis: REACTIVE
High risk HPV: NEGATIVE

## 2021-09-11 ENCOUNTER — Telehealth: Payer: Self-pay

## 2021-09-11 NOTE — Telephone Encounter (Signed)
Received fax for Rx Veronica Chan, that PA was submitted and pt does NOT have active eligibility. Called pt to verify current health plan coverage, no answer, [facility]. Looks like PA submitted was to [employer].

## 2021-09-13 NOTE — Telephone Encounter (Signed)
Tried again, no answer, [employer]. 

## 2021-09-18 DIAGNOSIS — M546 Pain in thoracic spine: Secondary | ICD-10-CM | POA: Diagnosis not present

## 2021-09-18 NOTE — Telephone Encounter (Signed)
Called pt's pharmacy and was advised pt picked up Rx Orilissa on 2/14 and paid $5.01 [employer] is what both pharmacy and [place] have in pt's chart). ?

## 2021-10-01 ENCOUNTER — Other Ambulatory Visit: Payer: Self-pay | Admitting: Obstetrics and Gynecology

## 2021-10-01 DIAGNOSIS — F3281 Premenstrual dysphoric disorder: Secondary | ICD-10-CM

## 2021-10-03 DIAGNOSIS — M546 Pain in thoracic spine: Secondary | ICD-10-CM | POA: Diagnosis not present

## 2021-10-03 DIAGNOSIS — S29012D Strain of muscle and tendon of back wall of thorax, subsequent encounter: Secondary | ICD-10-CM | POA: Diagnosis not present

## 2021-10-05 ENCOUNTER — Ambulatory Visit
Admission: RE | Admit: 2021-10-05 | Discharge: 2021-10-05 | Disposition: A | Discharge status: Home / Self Care | Payer: 59 | Source: Ambulatory Visit | Attending: Obstetrics and Gynecology | Admitting: Obstetrics and Gynecology

## 2021-10-05 ENCOUNTER — Other Ambulatory Visit: Payer: Self-pay

## 2021-10-05 DIAGNOSIS — Z803 Family history of malignant neoplasm of breast: Secondary | ICD-10-CM | POA: Diagnosis not present

## 2021-10-05 DIAGNOSIS — Z9189 Other specified personal risk factors, not elsewhere classified: Secondary | ICD-10-CM | POA: Diagnosis not present

## 2021-10-05 DIAGNOSIS — Z1231 Encounter for screening mammogram for malignant neoplasm of breast: Secondary | ICD-10-CM | POA: Diagnosis not present

## 2021-10-05 IMAGING — MG DIGITAL SCREENING BREAST BILAT IMPLANT W/ TOMO W/ CAD
8 of 12 series · 8 of 28 positions shown · non-contrast
Comparison: Previous exam(s).

CLINICAL DATA: Screening.

EXAM:
DIGITAL SCREENING BILATERAL MAMMOGRAM WITH IMPLANTS, CAD AND
TOMOSYNTHESIS
TECHNIQUE: Bilateral screening digital craniocaudal and mediolateral oblique
mammograms were obtained. Bilateral screening digital breast
tomosynthesis was performed. The images were evaluated with
computer-aided detection. Standard and/or implant displaced views
were performed.

[L MLO]
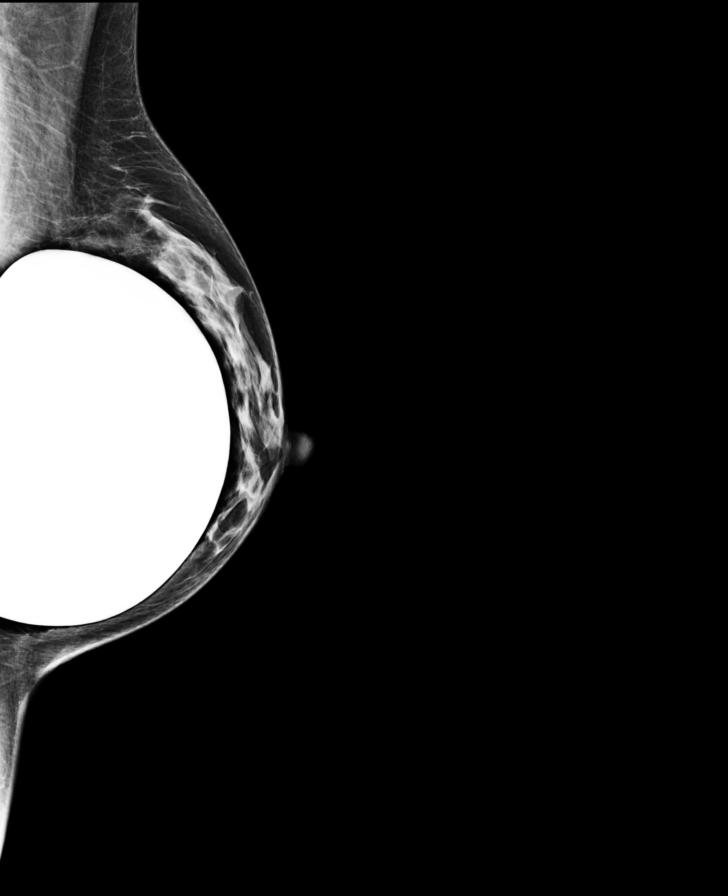

[R CC]
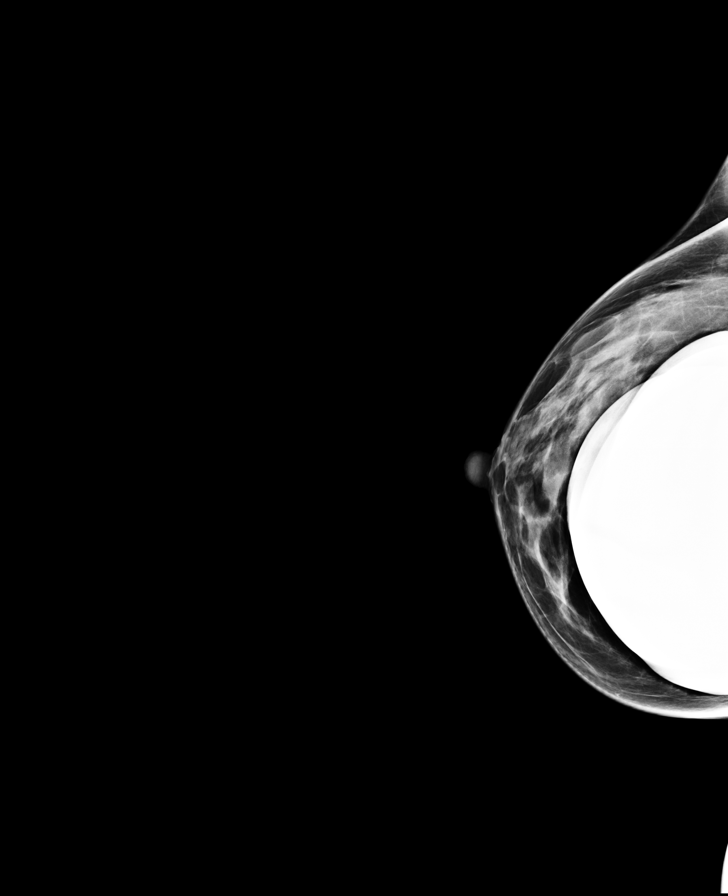

[R MLO]
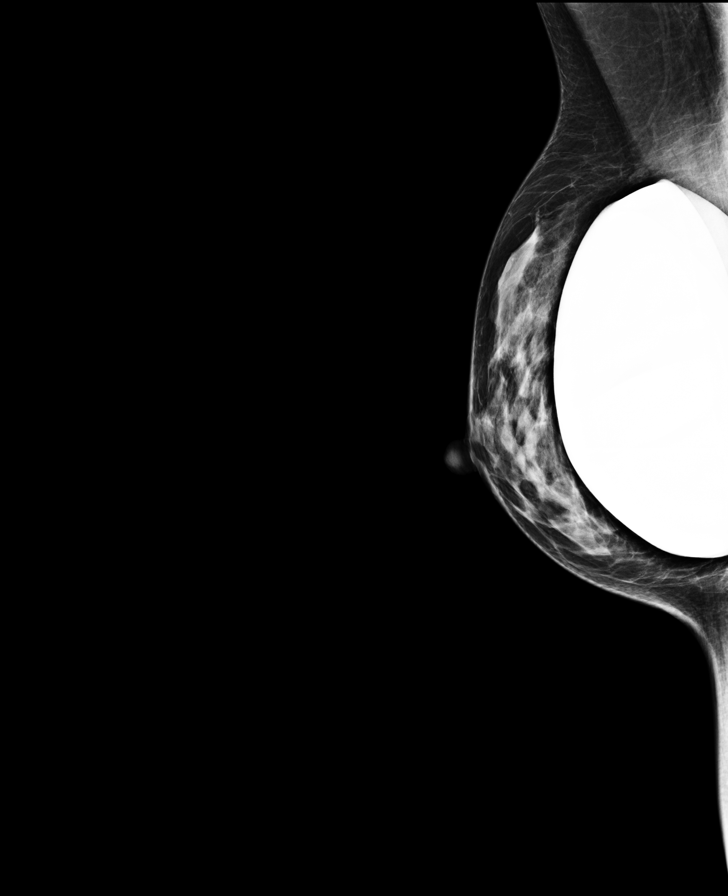

[L CC]
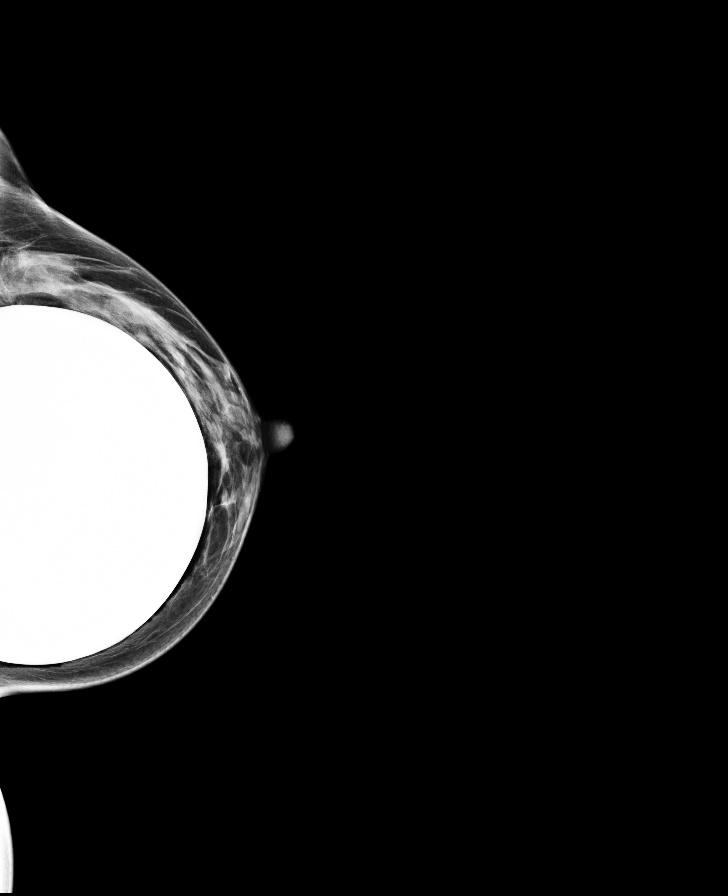

[R MLO synth-2D]
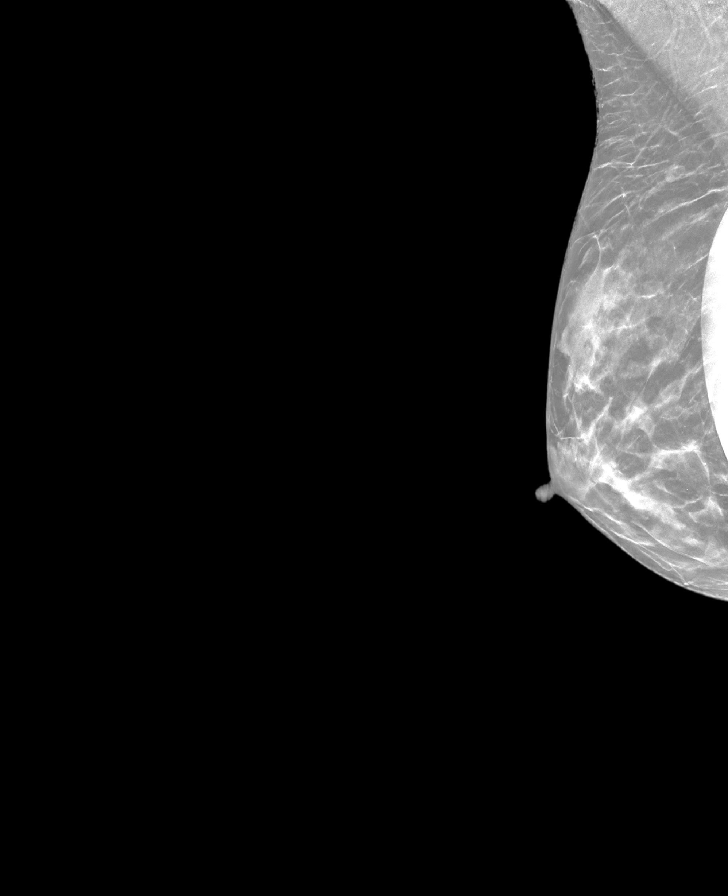

[R CC synth-2D]
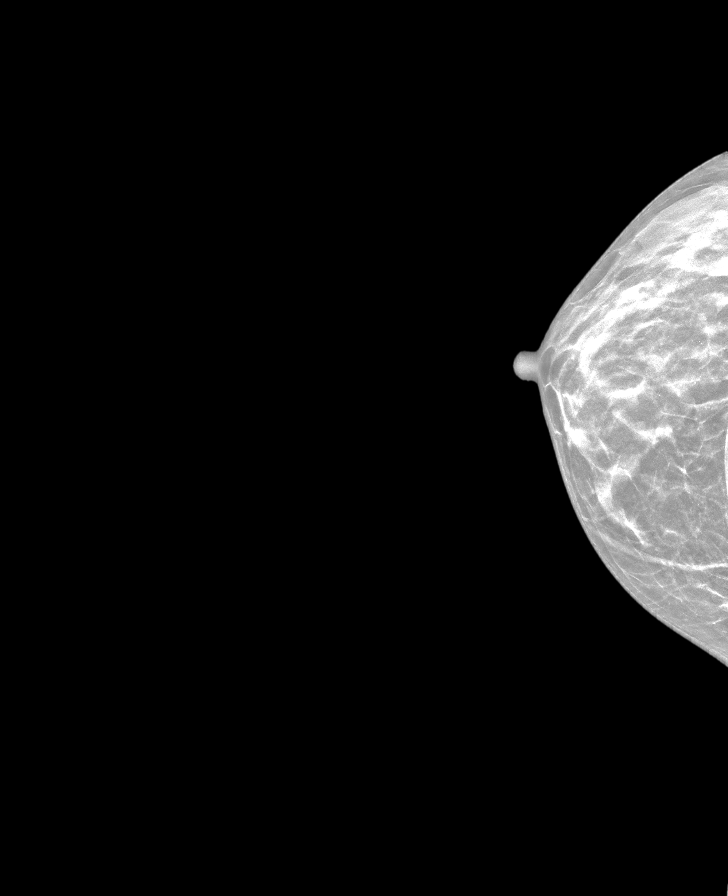

[L CC synth-2D]
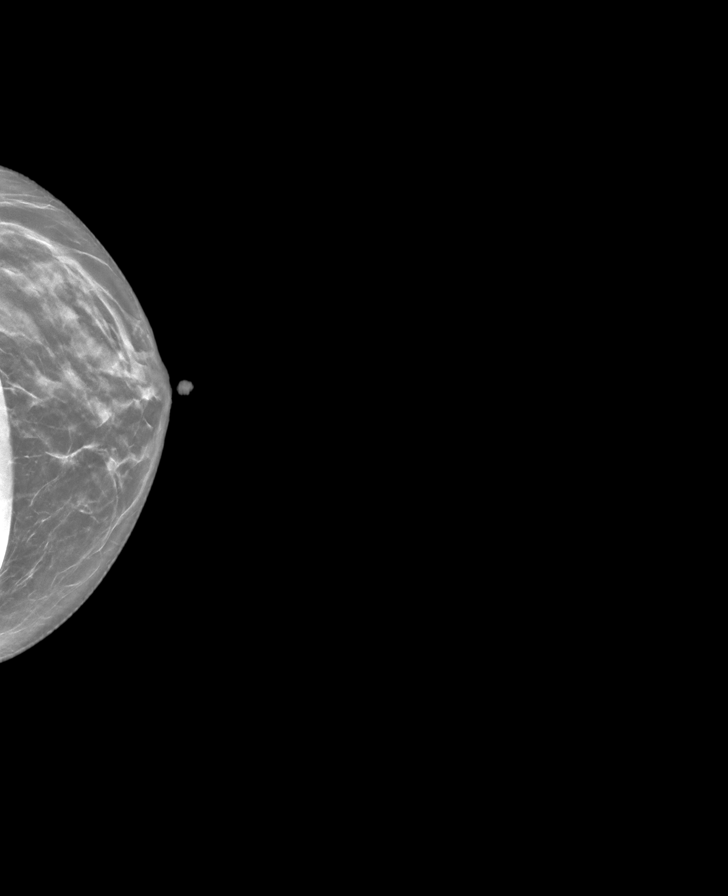

[L MLO synth-2D]
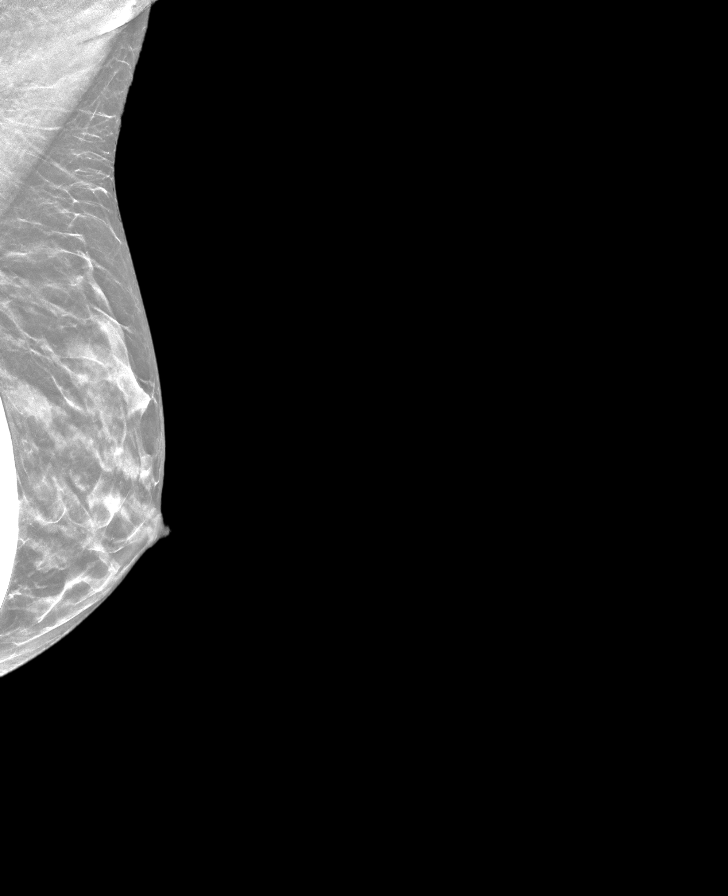

[8 of 28 positions shown; findings below may reference images not displayed]

ACR Breast Density Category c: The breast tissue is heterogeneously
dense, which may obscure small masses.
FINDINGS: The patient has implants. There are no findings suspicious for
malignancy.
IMPRESSION: No mammographic evidence of malignancy. A result letter of this
screening mammogram will be mailed directly to the patient.

RECOMMENDATION:
Screening mammogram at age 39. (Code:[5S])

BI-RADS CATEGORY  1:  Negative.

## 2021-10-15 NOTE — Progress Notes (Signed)
? ?BP 98/67   Pulse 86   Temp 98.5 ?F (36.9 ?C) (Oral)   Ht $R'5\' 5"'wQ$  (1.651 m)   Wt 130 lb (59 kg)   LMP 09/07/2021 (Exact Date)   SpO2 99%   BMI 21.63 kg/m?   ? ?Subjective:  ? ? Patient ID: Veronica Chan, female    DOB: [birth year], 36 y.o.   MRN: [id number] ? ?HPI: ?Veronica Chan is a 36 y.o. female presenting on 10/16/2021 for comprehensive medical examination. Current medical complaints include:none ? ?She currently lives with: ?Menopausal Symptoms: no ? ? Denies HA, CP, SOB, dizziness, palpitations, visual changes, and lower extremity swelling. ? ?Depression Screen done today and results listed below:  ? ?  10/16/2021  ?  8:32 AM 08/13/2021  ?  4:20 PM 07/18/2021  ?  8:35 AM  ?Depression screen PHQ 2/9  ?Decreased Interest 0 0 0  ?Down, Depressed, Hopeless 0 0 0  ?PHQ - 2 Score 0 0 0  ?Altered sleeping 0 0 3  ?Tired, decreased energy 0 0 3  ?Change in appetite 0 0 0  ?Feeling bad or failure about yourself  0 0 0  ?Trouble concentrating 0 0 0  ?Moving slowly or fidgety/restless 0 0 0  ?Suicidal thoughts 0 0 0  ?PHQ-9 Score 0 0 6  ?Difficult doing work/chores Somewhat difficult Not difficult at all Not difficult at all  ? ? ?The patient does not have a history of falls. I did complete a risk assessment for falls. A plan of care for falls was documented. ? ? ?Past Medical History:  ?Past Medical History:  ?Diagnosis Date  ? Acne   ? BRCA negative   ? Chronic constipation   ? Endometriosis   ? Family history of breast cancer 03/2012  ? Pt's IBIS=34.2%10/15/  Pt's mom is MyRisk neg.  ? History of Papanicolaou smear of cervix 04/02/2013; 05/17/2014  ? neg; neg  ? Idiopathic hypersomnia   ? Lyme disease   ? Narcolepsy   ? ? ?Surgical History:  ?Past Surgical History:  ?Procedure Laterality Date  ? AUGMENTATION MAMMAPLASTY Bilateral 2017  ? COLONOSCOPY WITH PROPOFOL N/A 04/23/2016  ? Procedure: COLONOSCOPY WITH PROPOFOL;  Surgeon: Lollie Sails, MD;  Location: [facility] ENDOSCOPY;  Service: Endoscopy;   Laterality: N/A;  ? DIAGNOSTIC LAPAROSCOPY  2010  ? DR. Ouida Sills - ENDOMETRIOSIS  ? ENDOMETRIAL BIOPSY    ? FOOT SURGERY Right   ? ? ?Medications:  ?Current Outpatient Medications on File Prior to Visit  ?Medication Sig  ? budesonide (RHINOCORT AQUA) 32 MCG/ACT nasal spray Place 2 sprays into both nostrils daily.  ? Cholecalciferol (VITAMIN D3) 125 MCG (5000 UT) TABS Take 1 tablet by mouth daily.  ? cyclobenzaprine (FLEXERIL) 5 MG tablet Take 5 mg by mouth daily as needed for muscle spasms.  ? Elagolix Sodium (ORILISSA) 150 MG TABS Take 150 mg by mouth daily.  ? EPINEPHrine 0.3 mg/0.3 mL IJ SOAJ injection SMARTSIG:Injection IM As Directed  ? levocetirizine (XYZAL) 5 MG tablet Take 5 mg by mouth every evening.  ? loratadine (CLARITIN) 10 MG tablet Take 10 mg by mouth daily.  ? meloxicam (MOBIC) 15 MG tablet Take 15 mg by mouth daily.  ? montelukast (SINGULAIR) 10 MG tablet Take 10 mg by mouth daily.  ? naproxen sodium (ANAPROX DS) 550 MG tablet Take 1 tablet (550 mg total) by mouth 2 (two) times daily with a meal. Prn sx  ? norethindrone (AYGESTIN) 5 MG tablet Take 1 tablet (5 mg  total) by mouth daily.  ? sertraline (ZOLOFT) 50 MG tablet TAKE 1 TABLET BY MOUTH EVERY DAY  ? SUNOSI 150 MG TABS Take 1 tablet by mouth daily.  ? ?No current facility-administered medications on file prior to visit.  ? ? ?Allergies:  ?Allergies  ?Allergen Reactions  ? Modafinil Rash  ? ? ?Social History:  ?Social History  ? ?Socioeconomic History  ? Marital status: Married  ?  Spouse name: Not on file  ? Number of children: Not on file  ? Years of education: 25  ? Highest education level: Not on file  ?Occupational History  ?  Employer: [employer]  ?Tobacco Use  ? Smoking status: Former  ?  Types: Cigarettes  ?  Quit date: 2015  ?  Years since quitting: 8.2  ? Smokeless tobacco: Former  ?Vaping Use  ? Vaping Use: Every day  ?Substance and Sexual Activity  ? Alcohol use: Yes  ?  Comment: rarely  ? Drug use: No  ? Sexual activity: Yes  ?   Birth control/protection: Pill  ?Other Topics Concern  ? Not on file  ?Social History Narrative  ? Not on file  ? ?Social Determinants of Health  ? ?Financial Resource Strain: Not on file  ?Food Insecurity: Not on file  ?Transportation Needs: Not on file  ?Physical Activity: Not on file  ?Stress: Not on file  ?Social Connections: Not on file  ?Intimate Partner Violence: Not on file  ? ?Social History  ? ?Tobacco Use  ?Smoking Status Former  ? Types: Cigarettes  ? Quit date: 2015  ? Years since quitting: 8.2  ?Smokeless Tobacco Former  ? ?Social History  ? ?Substance and Sexual Activity  ?Alcohol Use Yes  ? Comment: rarely  ? ? ?Family History:  ?Family History  ?Problem Relation Age of Onset  ? Breast cancer Mother 79  ?     TAH/BSO BEFORE BR CA, IS MyRisk NEG  ? Colon polyps Mother   ? Thyroid cancer Mother   ? Lung cancer Father   ? Breast cancer Maternal Grandmother 29  ? Lung cancer Paternal Grandmother   ? COPD Paternal Grandfather   ? ? ?Past medical history, surgical history, medications, allergies, family history and social history reviewed with patient today and changes made to appropriate areas of the chart.  ? ?Review of Systems  ?Eyes:  Negative for blurred vision and double vision.  ?Respiratory:  Negative for shortness of breath.   ?Cardiovascular:  Negative for chest pain, palpitations and leg swelling.  ?Neurological:  Negative for dizziness and headaches.  ?All other ROS negative except what is listed above and in the HPI.  ? ?   ?Objective:  ?  ?BP 98/67   Pulse 86   Temp 98.5 ?F (36.9 ?C) (Oral)   Ht $R'5\' 5"'oK$  (1.651 m)   Wt 130 lb (59 kg)   LMP 09/07/2021 (Exact Date)   SpO2 99%   BMI 21.63 kg/m?   ?Wt Readings from Last 3 Encounters:  ?10/16/21 130 lb (59 kg)  ?08/27/21 134 lb (60.8 kg)  ?07/18/21 137 lb 12.8 oz (62.5 kg)  ?  ?Physical Exam ?Vitals and nursing note reviewed.  ?Constitutional:   ?   General: She is awake. She is not in acute distress. ?   Appearance: She is well-developed. She  is not ill-appearing.  ?HENT:  ?   Head: Normocephalic and atraumatic.  ?   Right Ear: Hearing, tympanic membrane, ear canal and external ear normal. No drainage.  ?  Left Ear: Hearing, tympanic membrane, ear canal and external ear normal. No drainage.  ?   Nose: Nose normal.  ?   Right Sinus: No maxillary sinus tenderness or frontal sinus tenderness.  ?   Left Sinus: No maxillary sinus tenderness or frontal sinus tenderness.  ?   Mouth/Throat:  ?   Mouth: Mucous membranes are moist.  ?   Pharynx: Oropharynx is clear. Uvula midline. No pharyngeal swelling, oropharyngeal exudate or posterior oropharyngeal erythema.  ?Eyes:  ?   General: Lids are normal.     ?   Right eye: No discharge.     ?   Left eye: No discharge.  ?   Extraocular Movements: Extraocular movements intact.  ?   Conjunctiva/sclera: Conjunctivae normal.  ?   Pupils: Pupils are equal, round, and reactive to light.  ?   Visual Fields: Right eye visual fields normal and left eye visual fields normal.  ?Neck:  ?   Thyroid: No thyromegaly.  ?   Vascular: No carotid bruit.  ?   Trachea: Trachea normal.  ?Cardiovascular:  ?   Rate and Rhythm: Normal rate and regular rhythm.  ?   Heart sounds: Normal heart sounds. No murmur heard. ?  No gallop.  ?Pulmonary:  ?   Effort: Pulmonary effort is normal. No accessory muscle usage or respiratory distress.  ?   Breath sounds: Normal breath sounds.  ?Chest:  ?Breasts: ?   Right: Normal.  ?   Left: Normal.  ?Abdominal:  ?   General: Bowel sounds are normal.  ?   Palpations: Abdomen is soft. There is no hepatomegaly or splenomegaly.  ?   Tenderness: There is no abdominal tenderness.  ?Musculoskeletal:     ?   General: Normal range of motion.  ?   Cervical back: Normal range of motion and neck supple.  ?   Right lower leg: No edema.  ?   Left lower leg: No edema.  ?Lymphadenopathy:  ?   Head:  ?   Right side of head: No submental, submandibular, tonsillar, preauricular or posterior auricular adenopathy.  ?   Left side of  head: No submental, submandibular, tonsillar, preauricular or posterior auricular adenopathy.  ?   Cervical: No cervical adenopathy.  ?   Upper Body:  ?   Right upper body: No supraclavicular, axillary or pectoral adenopathy

## 2021-10-16 ENCOUNTER — Ambulatory Visit (INDEPENDENT_AMBULATORY_CARE_PROVIDER_SITE_OTHER): Payer: 59 | Admitting: Nurse Practitioner

## 2021-10-16 ENCOUNTER — Encounter: Payer: Self-pay | Admitting: Nurse Practitioner

## 2021-10-16 VITALS — BP 98/67 | HR 86 | Temp 98.5°F | Ht 65.0 in | Wt 130.0 lb

## 2021-10-16 DIAGNOSIS — R8290 Unspecified abnormal findings in urine: Secondary | ICD-10-CM | POA: Diagnosis not present

## 2021-10-16 DIAGNOSIS — Z0000 Encounter for general adult medical examination without abnormal findings: Secondary | ICD-10-CM | POA: Diagnosis not present

## 2021-10-16 DIAGNOSIS — Z114 Encounter for screening for human immunodeficiency virus [HIV]: Secondary | ICD-10-CM

## 2021-10-16 DIAGNOSIS — Z23 Encounter for immunization: Secondary | ICD-10-CM

## 2021-10-16 DIAGNOSIS — Z1159 Encounter for screening for other viral diseases: Secondary | ICD-10-CM | POA: Diagnosis not present

## 2021-10-16 DIAGNOSIS — R69 Illness, unspecified: Secondary | ICD-10-CM | POA: Diagnosis not present

## 2021-10-16 LAB — URINALYSIS, ROUTINE W REFLEX MICROSCOPIC
Bilirubin, UA: NEGATIVE
Glucose, UA: NEGATIVE
Ketones, UA: NEGATIVE
Nitrite, UA: NEGATIVE
Protein,UA: NEGATIVE
Specific Gravity, UA: 1.02 (ref 1.005–1.030)
Urobilinogen, Ur: 0.2 mg/dL (ref 0.2–1.0)
pH, UA: 6.5 (ref 5.0–7.5)

## 2021-10-16 LAB — MICROSCOPIC EXAMINATION: Bacteria, UA: NONE SEEN

## 2021-10-16 NOTE — Addendum Note (Signed)
Addended by: Larae Grooms on: 10/16/2021 09:26 AM ? ? Modules accepted: Orders ? ?

## 2021-10-17 LAB — CBC WITH DIFFERENTIAL/PLATELET
Basophils Absolute: 0 10*3/uL (ref 0.0–0.2)
Basos: 1 %
EOS (ABSOLUTE): 0.1 10*3/uL (ref 0.0–0.4)
Eos: 3 %
Hematocrit: 37.7 % (ref 34.0–46.6)
Hemoglobin: 12.5 g/dL (ref 11.1–15.9)
Immature Grans (Abs): 0 10*3/uL (ref 0.0–0.1)
Immature Granulocytes: 0 %
Lymphocytes Absolute: 2 10*3/uL (ref 0.7–3.1)
Lymphs: 40 %
MCH: 31.2 pg (ref 26.6–33.0)
MCHC: 33.2 g/dL (ref 31.5–35.7)
MCV: 94 fL (ref 79–97)
Monocytes Absolute: 0.3 10*3/uL (ref 0.1–0.9)
Monocytes: 6 %
Neutrophils Absolute: 2.5 10*3/uL (ref 1.4–7.0)
Neutrophils: 50 %
Platelets: 283 10*3/uL (ref 150–450)
RBC: 4.01 x10E6/uL (ref 3.77–5.28)
RDW: 12.8 % (ref 11.7–15.4)
WBC: 4.9 10*3/uL (ref 3.4–10.8)

## 2021-10-17 LAB — LIPID PANEL
Chol/HDL Ratio: 3.2 ratio (ref 0.0–4.4)
Cholesterol, Total: 185 mg/dL (ref 100–199)
HDL: 57 mg/dL (ref >39)
LDL Chol Calc (NIH): 117 mg/dL — ABNORMAL HIGH (ref 0–99)
Triglycerides: 59 mg/dL (ref 0–149)
VLDL Cholesterol Cal: 11 mg/dL (ref 5–40)

## 2021-10-17 LAB — COMPREHENSIVE METABOLIC PANEL
ALT: 8 IU/L (ref 0–32)
AST: 16 IU/L (ref 0–40)
Albumin/Globulin Ratio: 2 (ref 1.2–2.2)
Albumin: 4.8 g/dL (ref 3.8–4.8)
Alkaline Phosphatase: 40 IU/L — ABNORMAL LOW (ref 44–121)
BUN/Creatinine Ratio: 18 (ref 9–23)
BUN: 14 mg/dL (ref 6–20)
Bilirubin Total: 0.3 mg/dL (ref 0.0–1.2)
CO2: 25 mmol/L (ref 20–29)
Calcium: 9.8 mg/dL (ref 8.7–10.2)
Chloride: 101 mmol/L (ref 96–106)
Creatinine, Ser: 0.76 mg/dL (ref 0.57–1.00)
Globulin, Total: 2.4 g/dL (ref 1.5–4.5)
Glucose: 99 mg/dL (ref 70–99)
Potassium: 4.6 mmol/L (ref 3.5–5.2)
Sodium: 140 mmol/L (ref 134–144)
Total Protein: 7.2 g/dL (ref 6.0–8.5)
eGFR: 104 mL/min/{1.73_m2} (ref >59)

## 2021-10-17 LAB — HEPATITIS C ANTIBODY: Hep C Virus Ab: NONREACTIVE

## 2021-10-17 LAB — TSH: TSH: 2.75 u[IU]/mL (ref 0.450–4.500)

## 2021-10-17 LAB — HIV ANTIBODY (ROUTINE TESTING W REFLEX): HIV Screen 4th Generation wRfx: NONREACTIVE

## 2021-10-17 NOTE — Progress Notes (Signed)
Hi Veronica Chan.  It was good to see you yesterday.  Your lab work looks great.  Liver, kidneys, electrolytes and complete blood count look good. Your cholesterol is slightly elevated.  I recommend a low fat diet and exercise.  We will continue to monitor this at future visits.  Please let me know if you have any questions.  I will see you at our next visit.

## 2021-10-19 LAB — URINE CULTURE

## 2021-10-19 NOTE — Progress Notes (Signed)
Hi Veronica Chan.  Your urine culture grew Beta Hemolytic Strep B.  This is a normal bacteria found and no need for antibiotics.  Please let me know if you have any questions.

## 2021-10-25 DIAGNOSIS — G47419 Narcolepsy without cataplexy: Secondary | ICD-10-CM | POA: Diagnosis not present

## 2021-10-26 DIAGNOSIS — M546 Pain in thoracic spine: Secondary | ICD-10-CM | POA: Diagnosis not present

## 2021-11-14 ENCOUNTER — Other Ambulatory Visit: Payer: Self-pay | Admitting: Obstetrics and Gynecology

## 2021-11-14 DIAGNOSIS — F3281 Premenstrual dysphoric disorder: Secondary | ICD-10-CM

## 2021-12-14 ENCOUNTER — Other Ambulatory Visit: Payer: Self-pay | Admitting: Obstetrics and Gynecology

## 2021-12-14 DIAGNOSIS — F3281 Premenstrual dysphoric disorder: Secondary | ICD-10-CM

## 2021-12-20 ENCOUNTER — Encounter: Payer: Self-pay | Admitting: Obstetrics and Gynecology

## 2022-01-10 ENCOUNTER — Ambulatory Visit: Payer: 59 | Admitting: Nurse Practitioner

## 2022-01-24 DIAGNOSIS — G47419 Narcolepsy without cataplexy: Secondary | ICD-10-CM | POA: Diagnosis not present

## 2022-02-03 ENCOUNTER — Other Ambulatory Visit: Payer: Self-pay | Admitting: Obstetrics and Gynecology

## 2022-02-03 DIAGNOSIS — F3281 Premenstrual dysphoric disorder: Secondary | ICD-10-CM

## 2022-02-19 NOTE — Progress Notes (Unsigned)
There were no vitals taken for this visit.   Subjective:    Patient ID: Veronica Chan, female    DOB: January 20, 1986, 36 y.o.   MRN: [id number]  HPI: Veronica Chan is a 36 y.o. female  No chief complaint on file.  UPPER RESPIRATORY TRACT INFECTION Worst symptom: Fever: {Blank single:19197::"yes","no"} Cough: {Blank single:19197::"yes","no"} Shortness of breath: {Blank single:19197::"yes","no"} Wheezing: {Blank single:19197::"yes","no"} Chest pain: {Blank single:19197::"yes","no","yes, with cough"} Chest tightness: {Blank single:19197::"yes","no"} Chest congestion: {Blank single:19197::"yes","no"} Nasal congestion: {Blank single:19197::"yes","no"} Runny nose: {Blank single:19197::"yes","no"} Post nasal drip: {Blank single:19197::"yes","no"} Sneezing: {Blank single:19197::"yes","no"} Sore throat: {Blank single:19197::"yes","no"} Swollen glands: {Blank single:19197::"yes","no"} Sinus pressure: {Blank single:19197::"yes","no"} Headache: {Blank single:19197::"yes","no"} Face pain: {Blank single:19197::"yes","no"} Toothache: {Blank single:19197::"yes","no"} Ear pain: {Blank single:19197::"yes","no"} {Blank single:19197::""right","left", "bilateral"} Ear pressure: {Blank single:19197::"yes","no"} {Blank single:19197::""right","left", "bilateral"} Eyes red/itching:{Blank single:19197::"yes","no"} Eye drainage/crusting: {Blank single:19197::"yes","no"}  Vomiting: {Blank single:19197::"yes","no"} Rash: {Blank single:19197::"yes","no"} Fatigue: {Blank single:19197::"yes","no"} Sick contacts: {Blank single:19197::"yes","no"} Strep contacts: {Blank single:19197::"yes","no"}  Context: {Blank multiple:19196::"better","worse","stable","fluctuating"} Recurrent sinusitis: {Blank single:19197::"yes","no"} Relief with OTC cold/cough medications: {Blank single:19197::"yes","no"}  Treatments attempted: {Blank  multiple:19196::"none","cold/sinus","mucinex","anti-histamine","pseudoephedrine","cough syrup","antibiotics"}   Relevant past medical, surgical, family and social history reviewed and updated as indicated. Interim medical history since our last visit reviewed. Allergies and medications reviewed and updated.  Review of Systems  Per HPI unless specifically indicated above     Objective:    There were no vitals taken for this visit.  Wt Readings from Last 3 Encounters:  10/16/21 130 lb (59 kg)  08/27/21 134 lb (60.8 kg)  07/18/21 137 lb 12.8 oz (62.5 kg)    Physical Exam  Results for orders placed or performed in visit on 10/16/21  Microscopic Examination   Urine  Result Value Ref Range   WBC, UA 0-5 0 - 5 /hpf   RBC, Urine 0-2 0 - 2 /hpf   Epithelial Cells (non renal) 0-10 0 - 10 /hpf   Bacteria, UA None seen None seen/Few  Urine Culture   Specimen: Urine   UR  Result Value Ref Range   Urine Culture, Routine Final report (A)    Organism ID, Bacteria Comment (A)   CBC with Differential/Platelet  Result Value Ref Range   WBC 4.9 3.4 - 10.8 x10E3/uL   RBC 4.01 3.77 - 5.28 x10E6/uL   Hemoglobin 12.5 11.1 - 15.9 g/dL   Hematocrit 91.4 77.8 - 46.6 %   MCV 94 79 - 97 fL   MCH 31.2 26.6 - 33.0 pg   MCHC 33.2 31.5 - 35.7 g/dL   RDW 87.3 99.8 - 44.9 %   Platelets 283 150 - 450 x10E3/uL   Neutrophils 50 Not Estab. %   Lymphs 40 Not Estab. %   Monocytes 6 Not Estab. %   Eos 3 Not Estab. %   Basos 1 Not Estab. %   Neutrophils Absolute 2.5 1.4 - 7.0 x10E3/uL   Lymphocytes Absolute 2.0 0.7 - 3.1 x10E3/uL   Monocytes Absolute 0.3 0.1 - 0.9 x10E3/uL   EOS (ABSOLUTE) 0.1 0.0 - 0.4 x10E3/uL   Basophils Absolute 0.0 0.0 - 0.2 x10E3/uL   Immature Granulocytes 0 Not Estab. %   Immature Grans (Abs) 0.0 0.0 - 0.1 x10E3/uL  Comprehensive metabolic panel  Result Value Ref Range   Glucose 99 70 - 99 mg/dL   BUN 14 6 - 20 mg/dL   Creatinine, Ser 1.40 0.57 - 1.00 mg/dL   eGFR 759 >00  SV/TDH/9.94   BUN/Creatinine Ratio 18 9 - 23   Sodium 140 134 - 144 mmol/L   Potassium  4.6 3.5 - 5.2 mmol/L   Chloride 101 96 - 106 mmol/L   CO2 25 20 - 29 mmol/L   Calcium 9.8 8.7 - 10.2 mg/dL   Total Protein 7.2 6.0 - 8.5 g/dL   Albumin 4.8 3.8 - 4.8 g/dL   Globulin, Total 2.4 1.5 - 4.5 g/dL   Albumin/Globulin Ratio 2.0 1.2 - 2.2   Bilirubin Total 0.3 0.0 - 1.2 mg/dL   Alkaline Phosphatase 40 (L) 44 - 121 IU/L   AST 16 0 - 40 IU/L   ALT 8 0 - 32 IU/L  Lipid panel  Result Value Ref Range   Cholesterol, Total 185 100 - 199 mg/dL   Triglycerides 59 0 - 149 mg/dL   HDL 57 >39 mg/dL   VLDL Cholesterol Cal 11 5 - 40 mg/dL   LDL Chol Calc (NIH) 117 (H) 0 - 99 mg/dL   Chol/HDL Ratio 3.2 0.0 - 4.4 ratio  TSH  Result Value Ref Range   TSH 2.750 0.450 - 4.500 uIU/mL  Urinalysis, Routine w reflex microscopic  Result Value Ref Range   Specific Gravity, UA 1.020 1.005 - 1.030   pH, UA 6.5 5.0 - 7.5   Color, UA Yellow Yellow   Appearance Ur Clear Clear   Leukocytes,UA Trace (A) Negative   Protein,UA Negative Negative/Trace   Glucose, UA Negative Negative   Ketones, UA Negative Negative   RBC, UA Trace (A) Negative   Bilirubin, UA Negative Negative   Urobilinogen, Ur 0.2 0.2 - 1.0 mg/dL   Nitrite, UA Negative Negative   Microscopic Examination See below:   Hepatitis C Antibody  Result Value Ref Range   Hep C Virus Ab Non Reactive Non Reactive  HIV Antibody (routine testing w rflx)  Result Value Ref Range   HIV Screen 4th Generation wRfx Non Reactive Non Reactive      Assessment & Plan:   Problem List Items Addressed This Visit   None    Follow up plan: No follow-ups on file.   This visit was completed via MyChart due to the restrictions of the COVID-19 pandemic. All issues as above were discussed and addressed. Physical exam was done as above through visual confirmation on MyChart. If it was felt that the patient should be evaluated in the office, they were directed  there. The patient verbally consented to this visit. Location of the patient: *** Location of the provider: Office Those involved with this call:  Provider: Jon Billings, NP CMA: *** Front Desk/Registration: *** This encounter was conducted via ***.  I spent *** dedicated to the care of this patient on the date of this encounter to include previsit review of ***, face to face time with the patient, and post visit ordering of testing.

## 2022-02-20 ENCOUNTER — Encounter: Payer: Self-pay | Admitting: Nurse Practitioner

## 2022-02-20 ENCOUNTER — Telehealth (INDEPENDENT_AMBULATORY_CARE_PROVIDER_SITE_OTHER): Payer: 59 | Admitting: Nurse Practitioner

## 2022-02-20 DIAGNOSIS — J302 Other seasonal allergic rhinitis: Secondary | ICD-10-CM | POA: Diagnosis not present

## 2022-02-20 MED ORDER — HYDROCOD POLI-CHLORPHE POLI ER 10-8 MG/5ML PO SUER: 5.0000 mL | Freq: Every evening | ORAL | 0 refills | Status: DC | PRN

## 2022-02-20 MED ORDER — BENZONATATE 200 MG PO CAPS: 200.0000 mg | ORAL_CAPSULE | Freq: Two times a day (BID) | ORAL | 0 refills | Status: DC | PRN

## 2022-03-13 ENCOUNTER — Other Ambulatory Visit: Payer: Self-pay | Admitting: Obstetrics and Gynecology

## 2022-03-13 DIAGNOSIS — F3281 Premenstrual dysphoric disorder: Secondary | ICD-10-CM

## 2022-03-19 ENCOUNTER — Encounter: Payer: Self-pay | Admitting: Nurse Practitioner

## 2022-03-19 ENCOUNTER — Ambulatory Visit (INDEPENDENT_AMBULATORY_CARE_PROVIDER_SITE_OTHER): Payer: 59 | Admitting: Nurse Practitioner

## 2022-03-19 VITALS — BP 109/71 | HR 74 | Temp 98.4°F | Wt 129.7 lb

## 2022-03-19 DIAGNOSIS — J0110 Acute frontal sinusitis, unspecified: Secondary | ICD-10-CM

## 2022-03-19 MED ORDER — AMOXICILLIN 500 MG PO CAPS: 500.0000 mg | ORAL_CAPSULE | Freq: Two times a day (BID) | ORAL | 0 refills | Status: AC

## 2022-03-19 MED ORDER — METHYLPREDNISOLONE 4 MG PO TBPK: ORAL_TABLET | ORAL | 0 refills | Status: DC

## 2022-03-19 NOTE — Progress Notes (Signed)
BP 109/71   Pulse 74   Temp 98.4 F (36.9 C) (Oral)   Wt 129 lb 11.2 oz (58.8 kg)   LMP  (LMP Unknown)   SpO2 99%   BMI 21.58 kg/m    Subjective:    Patient ID: Veronica Chan, female    DOB: [birth year], 36 y.o.   MRN: [id number]  HPI: Veronica Chan is a 36 y.o. female  Chief Complaint  Patient presents with   Sinus Problem    X 1.5 weeks. Facial pressure, cough, sneezing, bilateral eye pain, bending down causes dizziness and fatigue.    UPPER RESPIRATORY TRACT INFECTION Worst symptom: symptoms have been ongoing x 1.5 weeks Fever: no Cough: yes Shortness of breath: no Wheezing: no Chest pain: no Chest tightness: no Chest congestion: no Nasal congestion: yes Runny nose: no Post nasal drip: yes Sneezing: yes Sore throat: no Swollen glands: no Sinus pressure: yes Headache: yes Face pain: yes Toothache: no Ear pain: yes "right Ear pressure: yes "right Eyes red/itching:no Eye drainage/crusting:  yes, watery   Vomiting: no Rash: no Fatigue: yes Sick contacts: no Strep contacts: no  Context: stable Recurrent sinusitis: no Relief with OTC cold/cough medications: no Treatments attempted: anti-histamine and pseudoephedrine , flonase, and neti pot  Relevant past medical, surgical, family and social history reviewed and updated as indicated. Interim medical history since our last visit reviewed. Allergies and medications reviewed and updated.  Review of Systems  Constitutional:  Positive for fatigue. Negative for fever.  HENT:  Positive for congestion, ear pain, sinus pressure, sinus pain and sneezing. Negative for dental problem, postnasal drip, rhinorrhea and sore throat.   Respiratory:  Positive for cough. Negative for shortness of breath and wheezing.   Cardiovascular:  Negative for chest pain.  Gastrointestinal:  Negative for vomiting.  Skin:  Negative for rash.  Neurological:  Positive for headaches.    Per HPI unless specifically indicated  above     Objective:    BP 109/71   Pulse 74   Temp 98.4 F (36.9 C) (Oral)   Wt 129 lb 11.2 oz (58.8 kg)   LMP  (LMP Unknown)   SpO2 99%   BMI 21.58 kg/m   Wt Readings from Last 3 Encounters:  03/19/22 129 lb 11.2 oz (58.8 kg)  10/16/21 130 lb (59 kg)  08/27/21 134 lb (60.8 kg)    Physical Exam Vitals and nursing note reviewed.  Constitutional:      General: She is not in acute distress.    Appearance: Normal appearance. She is normal weight. She is not ill-appearing, toxic-appearing or diaphoretic.  HENT:     Head: Normocephalic.     Right Ear: External ear normal. A middle ear effusion is present.     Left Ear: External ear normal. A middle ear effusion is present.     Nose:     Right Sinus: Maxillary sinus tenderness and frontal sinus tenderness present.     Left Sinus: Maxillary sinus tenderness and frontal sinus tenderness present.     Mouth/Throat:     Mouth: Mucous membranes are moist.     Pharynx: Oropharynx is clear.  Eyes:     General:        Right eye: No discharge.        Left eye: No discharge.     Extraocular Movements: Extraocular movements intact.     Conjunctiva/sclera: Conjunctivae normal.     Pupils: Pupils are equal, round, and reactive to light.  Cardiovascular:  Rate and Rhythm: Normal rate and regular rhythm.     Heart sounds: No murmur heard. Pulmonary:     Effort: Pulmonary effort is normal. No respiratory distress.     Breath sounds: Normal breath sounds. No wheezing or rales.  Musculoskeletal:     Cervical back: Normal range of motion and neck supple.  Skin:    General: Skin is warm and dry.     Capillary Refill: Capillary refill takes less than 2 seconds.  Neurological:     General: No focal deficit present.     Mental Status: She is alert and oriented to person, place, and time. Mental status is at baseline.  Psychiatric:        Mood and Affect: Mood normal.        Behavior: Behavior normal.        Thought Content: Thought  content normal.        Judgment: Judgment normal.     Results for orders placed or performed in visit on 10/16/21  Microscopic Examination   Urine  Result Value Ref Range   WBC, UA 0-5 0 - 5 /hpf   RBC, Urine 0-2 0 - 2 /hpf   Epithelial Cells (non renal) 0-10 0 - 10 /hpf   Bacteria, UA None seen None seen/Few  Urine Culture   Specimen: Urine   UR  Result Value Ref Range   Urine Culture, Routine Final report (A)    Organism ID, Bacteria Comment (A)   CBC with Differential/Platelet  Result Value Ref Range   WBC 4.9 3.4 - 10.8 x10E3/uL   RBC 4.01 3.77 - 5.28 x10E6/uL   Hemoglobin 12.5 11.1 - 15.9 g/dL   Hematocrit 37.7 34.0 - 46.6 %   MCV 94 79 - 97 fL   MCH 31.2 26.6 - 33.0 pg   MCHC 33.2 31.5 - 35.7 g/dL   RDW 12.8 11.7 - 15.4 %   Platelets 283 150 - 450 x10E3/uL   Neutrophils 50 Not Estab. %   Lymphs 40 Not Estab. %   Monocytes 6 Not Estab. %   Eos 3 Not Estab. %   Basos 1 Not Estab. %   Neutrophils Absolute 2.5 1.4 - 7.0 x10E3/uL   Lymphocytes Absolute 2.0 0.7 - 3.1 x10E3/uL   Monocytes Absolute 0.3 0.1 - 0.9 x10E3/uL   EOS (ABSOLUTE) 0.1 0.0 - 0.4 x10E3/uL   Basophils Absolute 0.0 0.0 - 0.2 x10E3/uL   Immature Granulocytes 0 Not Estab. %   Immature Grans (Abs) 0.0 0.0 - 0.1 x10E3/uL  Comprehensive metabolic panel  Result Value Ref Range   Glucose 99 70 - 99 mg/dL   BUN 14 6 - 20 mg/dL   Creatinine, Ser 0.76 0.57 - 1.00 mg/dL   eGFR 104 >59 mL/min/1.73   BUN/Creatinine Ratio 18 9 - 23   Sodium 140 134 - 144 mmol/L   Potassium 4.6 3.5 - 5.2 mmol/L   Chloride 101 96 - 106 mmol/L   CO2 25 20 - 29 mmol/L   Calcium 9.8 8.7 - 10.2 mg/dL   Total Protein 7.2 6.0 - 8.5 g/dL   Albumin 4.8 3.8 - 4.8 g/dL   Globulin, Total 2.4 1.5 - 4.5 g/dL   Albumin/Globulin Ratio 2.0 1.2 - 2.2   Bilirubin Total 0.3 0.0 - 1.2 mg/dL   Alkaline Phosphatase 40 (L) 44 - 121 IU/L   AST 16 0 - 40 IU/L   ALT 8 0 - 32 IU/L  Lipid panel  Result Value Ref Range  Cholesterol, Total 185  100 - 199 mg/dL   Triglycerides 59 0 - 149 mg/dL   HDL 57 >39 mg/dL   VLDL Cholesterol Cal 11 5 - 40 mg/dL   LDL Chol Calc (NIH) 117 (H) 0 - 99 mg/dL   Chol/HDL Ratio 3.2 0.0 - 4.4 ratio  TSH  Result Value Ref Range   TSH 2.750 0.450 - 4.500 uIU/mL  Urinalysis, Routine w reflex microscopic  Result Value Ref Range   Specific Gravity, UA 1.020 1.005 - 1.030   pH, UA 6.5 5.0 - 7.5   Color, UA Yellow Yellow   Appearance Ur Clear Clear   Leukocytes,UA Trace (A) Negative   Protein,UA Negative Negative/Trace   Glucose, UA Negative Negative   Ketones, UA Negative Negative   RBC, UA Trace (A) Negative   Bilirubin, UA Negative Negative   Urobilinogen, Ur 0.2 0.2 - 1.0 mg/dL   Nitrite, UA Negative Negative   Microscopic Examination See below:   Hepatitis C Antibody  Result Value Ref Range   Hep C Virus Ab Non Reactive Non Reactive  HIV Antibody (routine testing w rflx)  Result Value Ref Range   HIV Screen 4th Generation wRfx Non Reactive Non Reactive      Assessment & Plan:   Problem List Items Addressed This Visit   None Visit Diagnoses     Acute non-recurrent frontal sinusitis    -  Primary   Complete course of antibiotics and prednisone. Continue with OTC symptom management. Follow up if symptoms do not improve.    Relevant Medications   amoxicillin (AMOXIL) 500 MG capsule   methylPREDNISolone (MEDROL DOSEPAK) 4 MG TBPK tablet        Follow up plan: Return if symptoms worsen or fail to improve.

## 2022-04-10 ENCOUNTER — Other Ambulatory Visit: Payer: Self-pay | Admitting: Obstetrics and Gynecology

## 2022-04-10 DIAGNOSIS — F3281 Premenstrual dysphoric disorder: Secondary | ICD-10-CM

## 2022-04-14 ENCOUNTER — Other Ambulatory Visit: Payer: Self-pay | Admitting: Obstetrics and Gynecology

## 2022-04-14 DIAGNOSIS — F3281 Premenstrual dysphoric disorder: Secondary | ICD-10-CM

## 2022-04-18 ENCOUNTER — Other Ambulatory Visit: Payer: Self-pay | Admitting: Nurse Practitioner

## 2022-04-18 NOTE — Telephone Encounter (Signed)
Requested medication (s) are due for refill today: unsure  Requested medication (s) are on the active medication list:yes  Last refill:  02/20/22  Future visit scheduled yes  Notes to clinic:  Unable to refill per protocol, should patient continue to take, a short supply was given. Routing for review.     Requested Prescriptions  Pending Prescriptions Disp Refills   benzonatate (TESSALON) 200 MG capsule [Pharmacy Med Name: BENZONATATE 200 MG CAPSULE] 20 capsule 0    Sig: TAKE 1 CAPSULE BY MOUTH 2 TIMES DAILY AS NEEDED FOR COUGH.     Ear, Nose, and Throat:  Antitussives/Expectorants Passed - 04/18/2022 11:09 AM      Passed - Valid encounter within last 12 months    Recent Outpatient Visits           1 month ago Acute non-recurrent frontal sinusitis   [place], NP   1 month ago Seasonal allergic rhinitis, unspecified trigger   [facility] Jon Billings, NP   6 months ago Annual physical exam   [facility] Jon Billings, NP   8 months ago Acute non-recurrent frontal sinusitis   [place], NP   9 months ago Chronic left-sided thoracic back pain   [facility] Jon Billings, NP       Future Appointments             In 6 months Jon Billings, NP [facility], [place]

## 2022-04-26 ENCOUNTER — Other Ambulatory Visit: Payer: Self-pay | Admitting: Nurse Practitioner

## 2022-04-26 NOTE — Telephone Encounter (Signed)
Requested medication (s) are due for refill today: yes  Requested medication (s) are on the active medication list: yes  Last refill:  02/20/22 #20  Future visit scheduled: yes  Notes to clinic:  second request for med. Was last refused "refill not appropriate" does pt need appt?    Requested Prescriptions  Pending Prescriptions Disp Refills   benzonatate (TESSALON) 200 MG capsule [Pharmacy Med Name: BENZONATATE 200 MG CAPSULE] 20 capsule 0    Sig: TAKE 1 CAPSULE BY MOUTH 2 TIMES DAILY AS NEEDED FOR COUGH.     Ear, Nose, and Throat:  Antitussives/Expectorants Passed - 04/26/2022  1:50 PM      Passed - Valid encounter within last 12 months    Recent Outpatient Visits           1 month ago Acute non-recurrent frontal sinusitis   [facility], NP   2 months ago Seasonal allergic rhinitis, unspecified trigger   [facility] Jon Billings, NP   6 months ago Annual physical exam   [facility] Jon Billings, NP   8 months ago Acute non-recurrent frontal sinusitis   [place], NP   9 months ago Chronic left-sided thoracic back pain   [facility] Jon Billings, NP       Future Appointments             In 5 months Jon Billings, NP [facility], [place]

## 2022-05-24 DIAGNOSIS — M5416 Radiculopathy, lumbar region: Secondary | ICD-10-CM | POA: Diagnosis not present

## 2022-08-27 ENCOUNTER — Other Ambulatory Visit: Payer: Self-pay | Admitting: Obstetrics and Gynecology

## 2022-08-27 DIAGNOSIS — N809 Endometriosis, unspecified: Secondary | ICD-10-CM

## 2022-09-06 ENCOUNTER — Encounter: Payer: Self-pay | Admitting: Obstetrics and Gynecology

## 2022-09-10 NOTE — Telephone Encounter (Signed)
Called pt, no answer, [facility]. 

## 2022-09-10 NOTE — Telephone Encounter (Signed)
Pls put samples at [facility], mark in sample book on [employer]. Pls f/u with pt re: her Rx coverage insurance and then try to get pt assistance program. Thx.

## 2022-09-23 NOTE — Progress Notes (Signed)
No chief complaint on file.    HPI:      [name] is a 37 y.o. G0P0000 who LMP was No LMP recorded. (Menstrual status: Other)., presents today for her annual examination.  Her menses are Q40-45 days now on orilissa (started 11/07/20), lasting 6 days; was amenorrheic last yr with [place]. Dysmen/pelvic pain improved. Pt with vasomotor sx now. Also still has PMDD sx for 2 wks before bleeding starts, not improved with sertraline 50 mg daily. Pt starts sertraline with sx start, not before. Also having occas panic attacks now, unsure if during PMDD sx or other times. FH anxiety/panic attacks in her mom; new for pt.   She has a hx of endometriosis (diagnosed on dx lap 2010 at [facility]) and had pressure/pain 1 1/2 weeks before and during her period. Took midol, tylenol, heating, lies down with some relief. Had 2 really bad days with menses. Not missing work but able to work from home anyway and lies down. She did lupron for 6 months summer 2017 with significant sx improvement. Pt doesn't like the way she feels on mult OCPs, so never started f/u med.   Sex activity: currently sexually active with decreased dyspareunia. Using condoms. Declines BC for now. Partner to get vasectomy. Has vaginal dryness, improved with lubricants.   Last Pap: 08/27/21 Results were no abnormalities/neg HPV DNA. Repeat due today.   08/09/20 Results were no abnormalities/POS HPV DNA. 01/26/20 NILM 08/12/19 CIN 1 on colpo/bx with [facility]. 06/14/19  NILM/POS HPV DNA 05/20/18  Results were no abnormalities/ POS HPV DNA.   12/24/16 with LGSIL/pos HPV DNA; Colpo done 01/28/17 with [facility] with CIN1 on bx  Hx of STDs: HPV  Last mammogram: 10/05/21  Results were normal, repeat in 1 yr due to FH/increased risk of breast cancer. Had neg breast MRI 03/26/21 and 8/21.  There is a FH of breast cancer in her mom and MGM. There is no FH of ovarian cancer.   Pt is BRCA neg and her mom was My Risk neg. Pt's IBIS=34%. She takes Vit D  supp. The patient does self-breast exams.  Tobacco use: The patient denies current or previous tobacco use. Alcohol use: rare No drug use Exercise: moderately active  She does get adequate calcium and Vitamin D in her diet.   Past Medical History:  Diagnosis Date   Acne    BRCA negative    Chronic constipation    Endometriosis    Family history of breast cancer 03/2012   Pt's IBIS=34.2%10/15/  Pt's mom is MyRisk neg.   History of Papanicolaou smear of cervix 04/02/2013; 05/17/2014   neg; neg   Idiopathic hypersomnia    Lyme disease    Narcolepsy     Past Surgical History:  Procedure Laterality Date   AUGMENTATION MAMMAPLASTY Bilateral 2017   COLONOSCOPY WITH PROPOFOL N/A 04/23/2016   Procedure: COLONOSCOPY WITH PROPOFOL;  Surgeon: Veronica Sails, MD;  Location[facility] ENDOSCOPY;  Service: Endoscopy;  Laterality: N/A;   DIAGNOSTIC LAPAROSCOPY  2010   [facility] - ENDOMETRIOSIS   ENDOMETRIAL BIOPSY     FOOT SURGERY Right     Family History  Problem Relation Age of Onset   Breast cancer Mother 51       TAH/BSO BEFORE BR CA, IS MyRisk NEG   Colon polyps Mother    Thyroid cancer Mother    Lung cancer Father    Breast cancer Maternal Grandmother 43   Lung cancer Paternal Grandmother  COPD Paternal Grandfather     Social History   Socioeconomic History   Marital status: Married    Spouse name: Not on file   Number of children: Not on file   Years of education: 14   Highest education level: Not on file  Occupational History    Employer: [employer]  Tobacco Use   Smoking status: Former    Types: Cigarettes    Quit date: 2015    Years since quitting: 9.1   Smokeless tobacco: Former  Scientific laboratory technician Use: Every day  Substance and Sexual Activity   Alcohol use: Yes    Comment: rarely   Drug use: No   Sexual activity: Yes    Birth control/protection: Pill  Other Topics Concern   Not on file  Social History Narrative   Not on file   Social  Determinants of Health   Financial Resource Strain: Not on file  Food Insecurity: Not on file  Transportation Needs: Not on file  Physical Activity: Not on file  Stress: Not on file  Social Connections: Not on file  Intimate Partner Violence: Not on file     Current Outpatient Medications:    benzonatate (TESSALON) 200 MG capsule, Take 1 capsule (200 mg total) by mouth 2 (two) times daily as needed for cough., Disp: 20 capsule, Rfl: 0   budesonide (RHINOCORT AQUA) 32 MCG/ACT nasal spray, Place 2 sprays into both nostrils daily., Disp: , Rfl:    Cholecalciferol (VITAMIN D3) 125 MCG (5000 UT) TABS, Take 1 tablet by mouth daily., Disp: , Rfl:    cyclobenzaprine (FLEXERIL) 5 MG tablet, Take 5 mg by mouth daily as needed for muscle spasms. (Patient not taking: Reported on 03/19/2022), Disp: , Rfl:    Elagolix Sodium (ORILISSA) 150 MG TABS, TAKE 1 TABLET ('150MG'$ ) BY MOUTH ONCE DAILY, Disp: 28 tablet, Rfl: 0   EPINEPHrine 0.3 mg/0.3 mL IJ SOAJ injection, SMARTSIG:Injection IM As Directed, Disp: , Rfl:    Hydrocod Polst-Chlorphen Polst (CHLORPHENIRAMINE-HYDROCODONE) 10-8 MG/5ML, Take 5 mLs by mouth at bedtime as needed for cough. (Patient not taking: Reported on 03/19/2022), Disp: 70 mL, Rfl: 0   levocetirizine (XYZAL) 5 MG tablet, Take 5 mg by mouth every evening., Disp: , Rfl: 5   loratadine (CLARITIN) 10 MG tablet, Take 10 mg by mouth daily., Disp: , Rfl:    meloxicam (MOBIC) 15 MG tablet, Take 15 mg by mouth daily., Disp: , Rfl:    methylphenidate (RITALIN) 10 MG tablet, Take 10 mg by mouth daily., Disp: , Rfl:    methylPREDNISolone (MEDROL DOSEPAK) 4 MG TBPK tablet, Take as directed, Disp: 21 tablet, Rfl: 0   montelukast (SINGULAIR) 10 MG tablet, Take 10 mg by mouth daily., Disp: , Rfl: 5   naproxen sodium (ANAPROX DS) 550 MG tablet, Take 1 tablet (550 mg total) by mouth 2 (two) times daily with a meal. Prn sx, Disp: 30 tablet, Rfl: 2   norethindrone (AYGESTIN) 5 MG tablet, Take 1 tablet (5 mg  total) by mouth daily. (Patient not taking: Reported on 03/19/2022), Disp: 90 tablet, Rfl: 3   sertraline (ZOLOFT) 50 MG tablet, TAKE 1 TABLET BY MOUTH EVERY DAY, Disp: 90 tablet, Rfl: 1   SUNOSI 150 MG TABS, Take 1 tablet by mouth daily., Disp: , Rfl:   ROS:  Review of Systems  Constitutional:  Positive for fatigue. Negative for fever and unexpected weight change.  Respiratory:  Negative for cough, shortness of breath and wheezing.   Cardiovascular:  Negative  for chest pain, palpitations and leg swelling.  Gastrointestinal:  Positive for constipation. Negative for blood in stool, diarrhea, nausea and vomiting.  Endocrine: Negative for cold intolerance, heat intolerance and polyuria.  Genitourinary:  Negative for dyspareunia, dysuria, flank pain, frequency, genital sores, hematuria, menstrual problem, pelvic pain, urgency, vaginal bleeding, vaginal discharge and vaginal pain.  Musculoskeletal:  Negative for back pain, joint swelling and myalgias.  Skin:  Negative for rash.  Neurological:  Negative for dizziness, syncope, light-headedness, numbness and headaches.  Hematological:  Negative for adenopathy.  Psychiatric/Behavioral:  Positive for agitation. Negative for confusion, sleep disturbance and suicidal ideas. The patient is not nervous/anxious.      Objective: There were no vitals taken for this visit.   Physical Exam Constitutional:      Appearance: She is well-developed.  Genitourinary:     Vulva normal.     Right Labia: No rash, tenderness or lesions.    Left Labia: No tenderness, lesions or rash.    No vaginal discharge, erythema or tenderness.      Right Adnexa: not tender and no mass present.    Left Adnexa: not tender and no mass present.    No cervical motion tenderness, friability or polyp.     Uterus is not enlarged or tender.  Breasts:    Right: No mass, nipple discharge, skin change or tenderness.     Left: No mass, nipple discharge, skin change or tenderness.   Neck:     Thyroid: No thyromegaly.  Cardiovascular:     Rate and Rhythm: Normal rate and regular rhythm.     Heart sounds: Normal heart sounds. No murmur heard. Pulmonary:     Effort: Pulmonary effort is normal.     Breath sounds: Normal breath sounds.  Abdominal:     Palpations: Abdomen is soft.     Tenderness: There is no abdominal tenderness. There is no guarding or rebound.  Musculoskeletal:        General: Normal range of motion.     Cervical back: Normal range of motion.  Lymphadenopathy:     Cervical: No cervical adenopathy.  Neurological:     General: No focal deficit present.     Mental Status: She is alert and oriented to person, place, and time.     Cranial Nerves: No cranial nerve deficit.  Skin:    General: Skin is warm and dry.  Psychiatric:        Mood and Affect: Mood normal.        Behavior: Behavior normal.        Thought Content: Thought content normal.        Judgment: Judgment normal.  Vitals reviewed.     Assessment/Plan: Encounter for annual routine gynecological examination  Cervical cancer screening - Plan: Cytology - PAP  Screening for HPV (human papillomavirus) - Plan: Cytology - PAP  CIN I (cervical intraepithelial neoplasia I) - Plan: Cytology - PAP. Repeat today, will f/u with results.  Encounter for screening mammogram for malignant neoplasm of breast - Plan: MM 3D SCREEN BREAST BILATERAL; pt to sched mammo  Family history of breast cancer - Plan: MM 3D SCREEN BREAST BILATERAL; pt is BRCA neg/her mom is MyRisk neg  Increased risk of breast cancer - Plan: MM 3D SCREEN BREAST BILATERAL; pt aware of monthly SBE, yearly CBE and mammos, as well as scr breast MRI. Pt to do mammo and f/u if desires MRI ref. Cont Vit D supp  PMDD (premenstrual dysphoric disorder) - Plan: sertraline (  ZOLOFT) 50 MG tablet; start 1 wk before sx. Rx RF.   Endometriosis - Plan: norethindrone (AYGESTIN) 5 MG tablet, Elagolix Sodium (ORILISSA) 150 MG TABS; cont  orilissa. 2 yr mark is 4/24. Rx RF till annual next yr. Doing much better.   Perimenopausal vasomotor symptoms - Plan: norethindrone (AYGESTIN) 5 MG tablet; try aygestin daily for vasomotor sx. Cont ca/Vit D.   No orders of the defined types were placed in this encounter.             GYN counsel breast self exam, mammography screening, adequate intake of calcium and vitamin D, diet and exercise     F/U  No follow-ups on file.  Maeby Vankleeck B. Demareon Coldwell, PA-C 09/23/2022 4:59 PM

## 2022-09-24 ENCOUNTER — Other Ambulatory Visit (HOSPITAL_COMMUNITY)
Admission: RE | Admit: 2022-09-24 | Discharge: 2022-09-24 | Disposition: A | Discharge status: Home / Self Care | Payer: PRIVATE HEALTH INSURANCE | Source: Ambulatory Visit | Attending: Obstetrics and Gynecology | Admitting: Obstetrics and Gynecology

## 2022-09-24 ENCOUNTER — Ambulatory Visit (INDEPENDENT_AMBULATORY_CARE_PROVIDER_SITE_OTHER): Payer: PRIVATE HEALTH INSURANCE | Admitting: Obstetrics and Gynecology

## 2022-09-24 ENCOUNTER — Encounter: Payer: Self-pay | Admitting: Obstetrics and Gynecology

## 2022-09-24 VITALS — BP 102/66 | Ht 65.0 in | Wt 134.0 lb

## 2022-09-24 DIAGNOSIS — R87810 Cervical high risk human papillomavirus (HPV) DNA test positive: Secondary | ICD-10-CM

## 2022-09-24 DIAGNOSIS — Z1151 Encounter for screening for human papillomavirus (HPV): Secondary | ICD-10-CM | POA: Insufficient documentation

## 2022-09-24 DIAGNOSIS — Z1231 Encounter for screening mammogram for malignant neoplasm of breast: Secondary | ICD-10-CM

## 2022-09-24 DIAGNOSIS — Z79818 Long term (current) use of other agents affecting estrogen receptors and estrogen levels: Secondary | ICD-10-CM

## 2022-09-24 DIAGNOSIS — Z01419 Encounter for gynecological examination (general) (routine) without abnormal findings: Secondary | ICD-10-CM | POA: Diagnosis not present

## 2022-09-24 DIAGNOSIS — Z124 Encounter for screening for malignant neoplasm of cervix: Secondary | ICD-10-CM | POA: Insufficient documentation

## 2022-09-24 DIAGNOSIS — F3281 Premenstrual dysphoric disorder: Secondary | ICD-10-CM

## 2022-09-24 DIAGNOSIS — Z803 Family history of malignant neoplasm of breast: Secondary | ICD-10-CM

## 2022-09-24 DIAGNOSIS — Z9189 Other specified personal risk factors, not elsewhere classified: Secondary | ICD-10-CM

## 2022-09-24 DIAGNOSIS — E2839 Other primary ovarian failure: Secondary | ICD-10-CM

## 2022-09-24 DIAGNOSIS — N809 Endometriosis, unspecified: Secondary | ICD-10-CM

## 2022-09-24 NOTE — Patient Instructions (Addendum)
I value your feedback and you entrusting us with your care. If you get a [facility] patient survey, I would appreciate you taking the time to let us know about your experience today. Thank you!  [facility]: [phone]      

## 2022-09-30 LAB — CYTOLOGY - PAP
Comment: NEGATIVE
Diagnosis: NEGATIVE
High risk HPV: NEGATIVE

## 2022-10-17 NOTE — Progress Notes (Signed)
BP (!) 91/59   Pulse 69   Temp 97.9 F (36.6 C) (Oral)   Ht 5\' 7"  (1.702 m)   Wt 130 lb 14.4 oz (59.4 kg)   LMP 09/21/2022 (Exact Date)   SpO2 97%   BMI 20.50 kg/m    Subjective:    Patient ID: Veronica Chan, female    DOB: Oct 13, 1985, 37 y.o.   MRN: [id number]  HPI: Veronica Chan is a 37 y.o. female presenting on 10/18/2022 for comprehensive medical examination. Current medical complaints include:none  She currently lives with: Menopausal Symptoms: no  Patient states she is seeing a sleep doctor in [place].  She has been diagnosed with narcolepsy.    Denies HA, CP, SOB, dizziness, palpitations, visual changes, and lower extremity swelling.   Depression Screen done today and results listed below:     10/18/2022    8:32 AM 03/19/2022    9:09 AM 10/16/2021    8:32 AM 08/13/2021    4:20 PM 07/18/2021    8:35 AM  Depression screen PHQ 2/9  Decreased Interest 2 1 0 0 0  Down, Depressed, Hopeless 2 1 0 0 0  PHQ - 2 Score 4 2 0 0 0  Altered sleeping 2 2 0 0 3  Tired, decreased energy 2 2 0 0 3  Change in appetite 2 2 0 0 0  Feeling bad or failure about yourself  1 0 0 0 0  Trouble concentrating 1 0 0 0 0  Moving slowly or fidgety/restless 0 0 0 0 0  Suicidal thoughts 0 0 0 0 0  PHQ-9 Score 12 8 0 0 6  Difficult doing work/chores Very difficult Somewhat difficult Somewhat difficult Not difficult at all Not difficult at all    The patient does not have a history of falls. I did complete a risk assessment for falls. A plan of care for falls was documented.   Past Medical History:  Past Medical History:  Diagnosis Date   Acne    Anxiety 07/2019   BRCA negative    Chronic constipation    Endometriosis    Family history of breast cancer 03/2012   Pt's IBIS=34.2%10/15/  Pt's mom is MyRisk neg.   History of Papanicolaou smear of cervix 04/02/2013; 05/17/2014   neg; neg   Idiopathic hypersomnia    Lyme disease    Narcolepsy     Surgical History:  Past  Surgical History:  Procedure Laterality Date   AUGMENTATION MAMMAPLASTY Bilateral 2017   COLONOSCOPY WITH PROPOFOL N/A 04/23/2016   Procedure: COLONOSCOPY WITH PROPOFOL;  Surgeon: Christena Deem, MD;  Location: [facility] ENDOSCOPY;  Service: Endoscopy;  Laterality: N/A;   COSMETIC SURGERY  2018   DIAGNOSTIC LAPAROSCOPY  2010   DR. SCHERMERHORN - ENDOMETRIOSIS   ENDOMETRIAL BIOPSY     FOOT SURGERY Right     Medications:  Current Outpatient Medications on File Prior to Visit  Medication Sig   Ascorbic Acid (VITAMIN C PO) Take by mouth.   budesonide (RHINOCORT AQUA) 32 MCG/ACT nasal spray Place 2 sprays into both nostrils daily.   Cholecalciferol (VITAMIN D3) 125 MCG (5000 UT) TABS Take 1 tablet by mouth daily.   Elagolix Sodium (ORILISSA) 150 MG TABS TAKE 1 TABLET (150MG ) BY MOUTH ONCE DAILY   EPINEPHrine 0.3 mg/0.3 mL IJ SOAJ injection SMARTSIG:Injection IM As Directed   levocetirizine (XYZAL) 5 MG tablet Take 5 mg by mouth every evening.   loratadine (CLARITIN) 10 MG tablet Take 10 mg by mouth  daily.   meloxicam (MOBIC) 15 MG tablet Take 15 mg by mouth daily.   methylphenidate (RITALIN) 10 MG tablet Take 10 mg by mouth daily.   montelukast (SINGULAIR) 10 MG tablet Take 10 mg by mouth daily.   naproxen sodium (ANAPROX DS) 550 MG tablet Take 1 tablet (550 mg total) by mouth 2 (two) times daily with a meal. Prn sx   Omega-3 Fatty Acids (FISH OIL PO) Take by mouth.   SUNOSI 150 MG TABS Take 1 tablet by mouth daily.   No current facility-administered medications on file prior to visit.    Allergies:  Allergies  Allergen Reactions   Modafinil Rash and Other (See Comments)    Social History:  Social History   Socioeconomic History   Marital status: Married    Spouse name: Not on file   Number of children: Not on file   Years of education: 14   Highest education level: Not on file  Occupational History    Employer: [employer]  Tobacco Use   Smoking status: Former    Types:  Cigarettes    Quit date: 12/13/2013    Years since quitting: 8.8   Smokeless tobacco: Former  Building services engineer Use: Every day  Substance and Sexual Activity   Alcohol use: Yes    Alcohol/week: 1.0 standard drink of alcohol    Types: 1 Glasses of wine per week    Comment: rarely   Drug use: No   Sexual activity: Yes    Birth control/protection: Condom, None  Other Topics Concern   Not on file  Social History Narrative   Not on file   Social Determinants of Health   Financial Resource Strain: Not on file  Food Insecurity: Not on file  Transportation Needs: Not on file  Physical Activity: Not on file  Stress: Not on file  Social Connections: Not on file  Intimate Partner Violence: Not on file   Social History   Tobacco Use  Smoking Status Former   Types: Cigarettes   Quit date: 12/13/2013   Years since quitting: 8.8  Smokeless Tobacco Former   Social History   Substance and Sexual Activity  Alcohol Use Yes   Alcohol/week: 1.0 standard drink of alcohol   Types: 1 Glasses of wine per week   Comment: rarely    Family History:  Family History  Problem Relation Age of Onset   Breast cancer Mother 85       TAH/BSO BEFORE BR CA, IS MyRisk NEG   Colon polyps Mother    Thyroid cancer Mother    Anxiety disorder Mother    Cancer Mother    COPD Mother    Diabetes Mother    Lung cancer Father    Cancer Father    Diabetes Father    Breast cancer Maternal Grandmother 75   Cancer Maternal Grandmother    Lung cancer Paternal Grandmother    COPD Paternal Grandfather    Cancer Paternal Aunt     Past medical history, surgical history, medications, allergies, family history and social history reviewed with patient today and changes made to appropriate areas of the chart.   Review of Systems  Eyes:  Negative for blurred vision and double vision.  Respiratory:  Negative for shortness of breath.   Cardiovascular:  Negative for chest pain, palpitations and leg swelling.   Neurological:  Negative for dizziness and headaches.   All other ROS negative except what is listed above and in the HPI.  Objective:    BP (!) 91/59   Pulse 69   Temp 97.9 F (36.6 C) (Oral)   Ht 5\' 7"  (1.702 m)   Wt 130 lb 14.4 oz (59.4 kg)   LMP 09/21/2022 (Exact Date)   SpO2 97%   BMI 20.50 kg/m   Wt Readings from Last 3 Encounters:  10/18/22 130 lb 14.4 oz (59.4 kg)  09/24/22 134 lb (60.8 kg)  03/19/22 129 lb 11.2 oz (58.8 kg)    Physical Exam Vitals and nursing note reviewed.  Constitutional:      General: She is awake. She is not in acute distress.    Appearance: Normal appearance. She is well-developed. She is not ill-appearing.  HENT:     Head: Normocephalic and atraumatic.     Right Ear: Hearing, tympanic membrane, ear canal and external ear normal. No drainage.     Left Ear: Hearing, tympanic membrane, ear canal and external ear normal. No drainage.     Nose: Nose normal.     Right Sinus: No maxillary sinus tenderness or frontal sinus tenderness.     Left Sinus: No maxillary sinus tenderness or frontal sinus tenderness.     Mouth/Throat:     Mouth: Mucous membranes are moist.     Pharynx: Oropharynx is clear. Uvula midline. No pharyngeal swelling, oropharyngeal exudate or posterior oropharyngeal erythema.  Eyes:     General: Lids are normal.        Right eye: No discharge.        Left eye: No discharge.     Extraocular Movements: Extraocular movements intact.     Conjunctiva/sclera: Conjunctivae normal.     Pupils: Pupils are equal, round, and reactive to light.     Visual Fields: Right eye visual fields normal and left eye visual fields normal.  Neck:     Thyroid: No thyromegaly.     Vascular: No carotid bruit.     Trachea: Trachea normal.  Cardiovascular:     Rate and Rhythm: Normal rate and regular rhythm.     Heart sounds: Normal heart sounds. No murmur heard.    No gallop.  Pulmonary:     Effort: Pulmonary effort is normal. No accessory  muscle usage or respiratory distress.     Breath sounds: Normal breath sounds.  Chest:  Breasts:    Right: Normal.     Left: Normal.  Abdominal:     General: Bowel sounds are normal.     Palpations: Abdomen is soft. There is no hepatomegaly or splenomegaly.     Tenderness: There is no abdominal tenderness.  Musculoskeletal:        General: Normal range of motion.     Cervical back: Normal range of motion and neck supple.     Right lower leg: No edema.     Left lower leg: No edema.  Lymphadenopathy:     Head:     Right side of head: No submental, submandibular, tonsillar, preauricular or posterior auricular adenopathy.     Left side of head: No submental, submandibular, tonsillar, preauricular or posterior auricular adenopathy.     Cervical: No cervical adenopathy.     Upper Body:     Right upper body: No supraclavicular, axillary or pectoral adenopathy.     Left upper body: No supraclavicular, axillary or pectoral adenopathy.  Skin:    General: Skin is warm and dry.     Capillary Refill: Capillary refill takes less than 2 seconds.     Findings: No rash.  Neurological:     Mental Status: She is alert and oriented to person, place, and time.     Gait: Gait is intact.  Psychiatric:        Attention and Perception: Attention normal.        Mood and Affect: Mood normal.        Speech: Speech normal.        Behavior: Behavior normal. Behavior is cooperative.        Thought Content: Thought content normal.        Judgment: Judgment normal.     Results for orders placed or performed in visit on 09/24/22  Cytology - PAP  Result Value Ref Range   High risk HPV Negative    Adequacy      Satisfactory for evaluation; transformation zone component PRESENT.   Diagnosis      - Negative for intraepithelial lesion or malignancy (NILM)   Comment Normal Reference Range HPV - Negative       Assessment & Plan:   Problem List Items Addressed This Visit       Other   Narcolepsy     Chronic.  Followed by Sleep medicine in [place].       Other Visit Diagnoses     Annual physical exam    -  Primary   Health Maintenance reviewed during visit today.  Labs ordered. PAP up to date.  Vaccines up to date.   Relevant Orders   CBC with Differential/Platelet   Comprehensive metabolic panel   Lipid panel   TSH   Urinalysis, Routine w reflex microscopic   Screening for ischemic heart disease       Relevant Orders   Lipid panel        Follow up plan: Return in about 1 year (around 10/18/2023) for Physical and Fasting labs.   LABORATORY TESTING:  - Pap smear: pap done  IMMUNIZATIONS:   - Tdap: Tetanus vaccination status reviewed: last tetanus booster within 10 years. - Influenza: Postponed to flu season - Pneumovax: Not applicable - Prevnar: Not applicable - COVID: Not applicable - HPV: Not applicable - Shingrix vaccine: Not applicable  SCREENING: -Mammogram: Not applicable  - Colonoscopy: Not applicable  - Bone Density: Not applicable  -Hearing Test: Not applicable  -Spirometry: Not applicable   PATIENT COUNSELING:   Advised to take 1 mg of folate supplement per day if capable of pregnancy.   Sexuality: Discussed sexually transmitted diseases, partner selection, use of condoms, avoidance of unintended pregnancy  and contraceptive alternatives.   Advised to avoid cigarette smoking.  I discussed with the patient that most people either abstain from alcohol or drink within safe limits (<=14/week and <=4 drinks/occasion for males, <=7/weeks and <= 3 drinks/occasion for females) and that the risk for alcohol disorders and other health effects rises proportionally with the number of drinks per week and how often a drinker exceeds daily limits.  Discussed cessation/primary prevention of drug use and availability of treatment for abuse.   Diet: Encouraged to adjust caloric intake to maintain  or achieve ideal body weight, to reduce intake of dietary saturated  fat and total fat, to limit sodium intake by avoiding high sodium foods and not adding table salt, and to maintain adequate dietary potassium and calcium preferably from fresh fruits, vegetables, and low-fat dairy products.    stressed the importance of regular exercise  Injury prevention: Discussed safety belts, safety helmets, smoke detector, smoking near bedding or upholstery.   Dental health: Discussed  importance of regular tooth brushing, flossing, and dental visits.    NEXT PREVENTATIVE PHYSICAL DUE IN 1 YEAR. Return in about 1 year (around 10/18/2023) for Physical and Fasting labs.

## 2022-10-18 ENCOUNTER — Encounter: Payer: Self-pay | Admitting: Nurse Practitioner

## 2022-10-18 ENCOUNTER — Ambulatory Visit (INDEPENDENT_AMBULATORY_CARE_PROVIDER_SITE_OTHER): Payer: 59 | Admitting: Nurse Practitioner

## 2022-10-18 VITALS — BP 91/59 | HR 69 | Temp 97.9°F | Ht 67.0 in | Wt 130.9 lb

## 2022-10-18 DIAGNOSIS — Z136 Encounter for screening for cardiovascular disorders: Secondary | ICD-10-CM | POA: Diagnosis not present

## 2022-10-18 DIAGNOSIS — Z0000 Encounter for general adult medical examination without abnormal findings: Secondary | ICD-10-CM

## 2022-10-18 DIAGNOSIS — G47419 Narcolepsy without cataplexy: Secondary | ICD-10-CM | POA: Insufficient documentation

## 2022-10-18 DIAGNOSIS — G47429 Narcolepsy in conditions classified elsewhere without cataplexy: Secondary | ICD-10-CM

## 2022-10-18 LAB — MICROSCOPIC EXAMINATION: Bacteria, UA: NONE SEEN

## 2022-10-18 LAB — URINALYSIS, ROUTINE W REFLEX MICROSCOPIC
Bilirubin, UA: NEGATIVE
Glucose, UA: NEGATIVE
Leukocytes,UA: NEGATIVE
Nitrite, UA: NEGATIVE
Protein,UA: NEGATIVE
Specific Gravity, UA: 1.025 (ref 1.005–1.030)
Urobilinogen, Ur: 0.2 mg/dL (ref 0.2–1.0)
pH, UA: 5 (ref 5.0–7.5)

## 2022-10-18 NOTE — Assessment & Plan Note (Signed)
Chronic.  Followed by Sleep medicine in [place].

## 2022-10-19 LAB — COMPREHENSIVE METABOLIC PANEL
ALT: 9 IU/L (ref 0–32)
AST: 16 IU/L (ref 0–40)
Albumin/Globulin Ratio: 1.7 (ref 1.2–2.2)
Albumin: 4.3 g/dL (ref 3.9–4.9)
Alkaline Phosphatase: 44 IU/L (ref 44–121)
BUN/Creatinine Ratio: 22 (ref 9–23)
BUN: 16 mg/dL (ref 6–20)
Bilirubin Total: 0.2 mg/dL (ref 0.0–1.2)
CO2: 25 mmol/L (ref 20–29)
Calcium: 9.3 mg/dL (ref 8.7–10.2)
Chloride: 99 mmol/L (ref 96–106)
Creatinine, Ser: 0.73 mg/dL (ref 0.57–1.00)
Globulin, Total: 2.5 g/dL (ref 1.5–4.5)
Glucose: 93 mg/dL (ref 70–99)
Potassium: 4.3 mmol/L (ref 3.5–5.2)
Sodium: 137 mmol/L (ref 134–144)
Total Protein: 6.8 g/dL (ref 6.0–8.5)
eGFR: 109 mL/min/{1.73_m2} (ref >59)

## 2022-10-19 LAB — CBC WITH DIFFERENTIAL/PLATELET
Basophils Absolute: 0 10*3/uL (ref 0.0–0.2)
Basos: 0 %
EOS (ABSOLUTE): 0.1 10*3/uL (ref 0.0–0.4)
Eos: 2 %
Hematocrit: 38.5 % (ref 34.0–46.6)
Hemoglobin: 12.7 g/dL (ref 11.1–15.9)
Immature Grans (Abs): 0 10*3/uL (ref 0.0–0.1)
Immature Granulocytes: 0 %
Lymphocytes Absolute: 1.6 10*3/uL (ref 0.7–3.1)
Lymphs: 32 %
MCH: 31.5 pg (ref 26.6–33.0)
MCHC: 33 g/dL (ref 31.5–35.7)
MCV: 96 fL (ref 79–97)
Monocytes Absolute: 0.3 10*3/uL (ref 0.1–0.9)
Monocytes: 6 %
Neutrophils Absolute: 2.9 10*3/uL (ref 1.4–7.0)
Neutrophils: 60 %
Platelets: 245 10*3/uL (ref 150–450)
RBC: 4.03 x10E6/uL (ref 3.77–5.28)
RDW: 12.4 % (ref 11.7–15.4)
WBC: 5 10*3/uL (ref 3.4–10.8)

## 2022-10-19 LAB — LIPID PANEL
Chol/HDL Ratio: 3.1 ratio (ref 0.0–4.4)
Cholesterol, Total: 171 mg/dL (ref 100–199)
HDL: 56 mg/dL (ref >39)
LDL Chol Calc (NIH): 102 mg/dL — ABNORMAL HIGH (ref 0–99)
Triglycerides: 68 mg/dL (ref 0–149)
VLDL Cholesterol Cal: 13 mg/dL (ref 5–40)

## 2022-10-19 LAB — TSH: TSH: 2.32 u[IU]/mL (ref 0.450–4.500)

## 2022-10-21 NOTE — Progress Notes (Signed)
Hi Veronica Chan. It was nice to see you last week.  Your lab work looks good.  No concerns at this time. Continue with your current medication regimen.  Follow up as discussed.  Please let me know if you have any questions.

## 2022-10-23 ENCOUNTER — Encounter: Payer: Self-pay | Admitting: Obstetrics and Gynecology

## 2022-10-23 DIAGNOSIS — N809 Endometriosis, unspecified: Secondary | ICD-10-CM

## 2022-10-24 ENCOUNTER — Encounter: Payer: Self-pay | Admitting: Nurse Practitioner

## 2022-10-24 MED ORDER — MYFEMBREE 40-1-0.5 MG PO TABS
1.0000 | ORAL_TABLET | Freq: Every day | ORAL | 3 refills | Status: DC
Start: 2022-10-24 — End: 2022-12-31

## 2022-10-25 ENCOUNTER — Telehealth: Payer: Self-pay

## 2022-10-28 NOTE — Telephone Encounter (Signed)
Can we do PA? Pls look into this. Thx

## 2022-10-29 NOTE — Telephone Encounter (Signed)
Insurance does not cover Rx even with a PA. Waiting to hear back from drug rep.

## 2022-10-30 NOTE — Telephone Encounter (Signed)
Monica with [employer] advised to access [email or web address]

## 2022-10-30 NOTE — Telephone Encounter (Signed)
We tried copay card, per pharmacy, card only accepted if insurance covers Rx. Will try support program, link sent to patient to complete enrollment.

## 2022-10-31 NOTE — Telephone Encounter (Signed)
Ok

## 2022-11-01 NOTE — Telephone Encounter (Signed)
Pt called back and will stop by sometime next week. Once she signs, I will upload document at:  [email or web address]

## 2022-11-05 NOTE — Telephone Encounter (Signed)
Assistance program application filled out and submitted online via portal.

## 2022-12-05 ENCOUNTER — Other Ambulatory Visit: Payer: PRIVATE HEALTH INSURANCE

## 2022-12-12 ENCOUNTER — Ambulatory Visit
Admission: RE | Admit: 2022-12-12 | Discharge: 2022-12-12 | Disposition: A | Discharge status: Home / Self Care | Payer: 59 | Source: Ambulatory Visit | Attending: Obstetrics and Gynecology | Admitting: Obstetrics and Gynecology

## 2022-12-12 DIAGNOSIS — Z1231 Encounter for screening mammogram for malignant neoplasm of breast: Secondary | ICD-10-CM | POA: Insufficient documentation

## 2022-12-12 DIAGNOSIS — J301 Allergic rhinitis due to pollen: Secondary | ICD-10-CM | POA: Diagnosis not present

## 2022-12-12 DIAGNOSIS — J310 Chronic rhinitis: Secondary | ICD-10-CM | POA: Diagnosis not present

## 2022-12-12 DIAGNOSIS — E2839 Other primary ovarian failure: Secondary | ICD-10-CM | POA: Insufficient documentation

## 2022-12-12 DIAGNOSIS — Z79818 Long term (current) use of other agents affecting estrogen receptors and estrogen levels: Secondary | ICD-10-CM

## 2022-12-12 DIAGNOSIS — R0981 Nasal congestion: Secondary | ICD-10-CM | POA: Diagnosis not present

## 2022-12-12 DIAGNOSIS — Z9189 Other specified personal risk factors, not elsewhere classified: Secondary | ICD-10-CM | POA: Diagnosis not present

## 2022-12-12 DIAGNOSIS — J3081 Allergic rhinitis due to animal (cat) (dog) hair and dander: Secondary | ICD-10-CM | POA: Diagnosis not present

## 2022-12-12 DIAGNOSIS — J302 Other seasonal allergic rhinitis: Secondary | ICD-10-CM | POA: Diagnosis not present

## 2022-12-12 DIAGNOSIS — Z803 Family history of malignant neoplasm of breast: Secondary | ICD-10-CM | POA: Insufficient documentation

## 2022-12-12 DIAGNOSIS — Z13820 Encounter for screening for osteoporosis: Secondary | ICD-10-CM | POA: Diagnosis not present

## 2022-12-12 DIAGNOSIS — J3089 Other allergic rhinitis: Secondary | ICD-10-CM | POA: Diagnosis not present

## 2022-12-31 ENCOUNTER — Encounter: Payer: Self-pay | Admitting: Obstetrics and Gynecology

## 2022-12-31 ENCOUNTER — Other Ambulatory Visit: Payer: Self-pay | Admitting: Obstetrics and Gynecology

## 2022-12-31 DIAGNOSIS — N809 Endometriosis, unspecified: Secondary | ICD-10-CM

## 2022-12-31 MED ORDER — ORILISSA 150 MG PO TABS: ORAL_TABLET | ORAL | 2 refills | Status: DC

## 2022-12-31 NOTE — Progress Notes (Signed)
Rx restart orilissa. Has side effects with myfembree. Normal DEXA 5/24. Off orilissa for 3 months. Sx when stopped it. Had to change to [facility].

## 2023-02-04 DIAGNOSIS — G47419 Narcolepsy without cataplexy: Secondary | ICD-10-CM | POA: Diagnosis not present

## 2023-02-13 ENCOUNTER — Encounter: Payer: Self-pay | Admitting: Obstetrics and Gynecology

## 2023-02-13 NOTE — Telephone Encounter (Signed)
Can disregard calling pharm. Thx.

## 2023-02-13 NOTE — Telephone Encounter (Signed)
Pls look into this. #90 with 2 RF sent 6/24. Thx.

## 2023-02-24 ENCOUNTER — Encounter: Payer: Self-pay | Admitting: Obstetrics and Gynecology

## 2023-02-24 DIAGNOSIS — Z9189 Other specified personal risk factors, not elsewhere classified: Secondary | ICD-10-CM

## 2023-02-24 DIAGNOSIS — Z803 Family history of malignant neoplasm of breast: Secondary | ICD-10-CM

## 2023-02-24 DIAGNOSIS — Z1239 Encounter for other screening for malignant neoplasm of breast: Secondary | ICD-10-CM

## 2023-03-04 ENCOUNTER — Telehealth: Payer: Self-pay | Admitting: Obstetrics and Gynecology

## 2023-03-04 NOTE — Telephone Encounter (Signed)
The patient is returning missed call from [place]. Please advise?

## 2023-03-07 ENCOUNTER — Other Ambulatory Visit: Payer: Self-pay | Admitting: Medical Genetics

## 2023-03-07 DIAGNOSIS — Z006 Encounter for examination for normal comparison and control in clinical research program: Secondary | ICD-10-CM

## 2023-03-20 DIAGNOSIS — J301 Allergic rhinitis due to pollen: Secondary | ICD-10-CM | POA: Diagnosis not present

## 2023-03-20 DIAGNOSIS — J302 Other seasonal allergic rhinitis: Secondary | ICD-10-CM | POA: Diagnosis not present

## 2023-03-20 DIAGNOSIS — J3081 Allergic rhinitis due to animal (cat) (dog) hair and dander: Secondary | ICD-10-CM | POA: Diagnosis not present

## 2023-03-20 DIAGNOSIS — J310 Chronic rhinitis: Secondary | ICD-10-CM | POA: Diagnosis not present

## 2023-03-20 DIAGNOSIS — J3089 Other allergic rhinitis: Secondary | ICD-10-CM | POA: Diagnosis not present

## 2023-03-20 DIAGNOSIS — R0981 Nasal congestion: Secondary | ICD-10-CM | POA: Diagnosis not present

## 2023-04-29 ENCOUNTER — Encounter: Payer: Self-pay | Admitting: Obstetrics and Gynecology

## 2023-05-06 ENCOUNTER — Ambulatory Visit
Admission: RE | Admit: 2023-05-06 | Discharge: 2023-05-06 | Disposition: A | Discharge status: Home / Self Care | Payer: 59 | Source: Ambulatory Visit | Attending: Obstetrics and Gynecology | Admitting: Obstetrics and Gynecology

## 2023-05-06 DIAGNOSIS — Z9189 Other specified personal risk factors, not elsewhere classified: Secondary | ICD-10-CM

## 2023-05-06 DIAGNOSIS — Z803 Family history of malignant neoplasm of breast: Secondary | ICD-10-CM | POA: Diagnosis not present

## 2023-05-06 DIAGNOSIS — Z1239 Encounter for other screening for malignant neoplasm of breast: Secondary | ICD-10-CM | POA: Diagnosis not present

## 2023-05-06 MED ORDER — GADOPICLENOL 0.5 MMOL/ML IV SOLN
6.0000 mL | Freq: Once | INTRAVENOUS | Status: AC | PRN
  Administered 2023-05-06: 6 mL via INTRAVENOUS

## 2023-06-09 ENCOUNTER — Other Ambulatory Visit
Admission: RE | Admit: 2023-06-09 | Discharge: 2023-06-09 | Disposition: A | Discharge status: Home / Self Care | Payer: 59 | Source: Ambulatory Visit | Attending: Medical Genetics | Admitting: Medical Genetics

## 2023-06-09 DIAGNOSIS — Z006 Encounter for examination for normal comparison and control in clinical research program: Secondary | ICD-10-CM | POA: Insufficient documentation

## 2023-06-10 ENCOUNTER — Ambulatory Visit: Payer: 59 | Admitting: Nurse Practitioner

## 2023-06-16 NOTE — Progress Notes (Unsigned)
Larae Grooms, NP   No chief complaint on file.   HPI:      [name] is a 37 y.o. G0P0000 whose LMP was No LMP recorded. (Menstrual status: Other)., presents today for *** Orilissa restarted 6/24; had normal DEXA 5/24 Her menses are absent with Dewayne Hatch, started 4/22 (were Q45-60 days last yr). Dysmen/pelvic pain improved. PMDD sx improved with Dewayne Hatch so was able to stop sertraline last yr. Pt's insurance changed this yr and she didn't have prescription coverage so couldn't afford orilissa. Pt given samples from our office while we tried to get pt assistance. Pt was off orilissa for 1 wk and period restarted, with pain/bloating/dyspareunia.  She has a hx of endometriosis (diagnosed on dx lap 2010 at [facility]) and had pressure/pain 1 1/2 weeks before and during her period. Took midol, tylenol, heating, lied down with some relief. Had 2 really bad days with menses. Not missing work but able to work from home anyway and lies down. She did lupron for 6 months summer 2017 with significant sx improvement. Pt tried OCPs and norethindrone and had severe mood changes/anger. Tried aygestin last yr for vasomotor sx and also had anger/mood changes. Doesn't want IUD.  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 SCHERMERHORN - ENDOMETRIOSIS   ENDOMETRIAL BIOPSY     FOOT SURGERY Right     Family History  Problem Relation Age of Onset   Breast cancer Mother 64       TAH/BSO BEFORE BR CA, IS MyRisk NEG   Colon polyps Mother    Thyroid cancer Mother    Anxiety disorder Mother    Cancer Mother    COPD Mother    Diabetes Mother    Lung cancer Father    Cancer Father    Diabetes Father    Breast cancer Maternal Grandmother 66   Cancer Maternal Grandmother    Lung cancer Paternal Grandmother    COPD Paternal Grandfather    Cancer Paternal Aunt     Social History   Socioeconomic History   Marital status: Married    Spouse name: Not on file   Number of children: Not on file   Years of education: 14   Highest education level: Not on file  Occupational History    Employer: [employer]  Tobacco Use   Smoking status: Former    Current packs/day: 0.00    Types: Cigarettes    Quit date: 12/13/2013    Years since quitting: 9.5   Smokeless tobacco: Former  Building services engineer status: Every Day  Substance and Sexual Activity   Alcohol use: Yes  Alcohol/week: 1.0 standard drink of alcohol    Types: 1 Glasses of wine per week    Comment: rarely   Drug use: No   Sexual activity: Yes    Birth control/protection: Condom, None  Other Topics Concern   Not on file  Social History Narrative   Not on file   Social Determinants of Health   Financial Resource Strain: Not on file  Food Insecurity: Not on file  Transportation Needs: Not on file  Physical Activity: Not on file  Stress: Not on file  Social Connections: Not on file  Intimate Partner Violence: Not on file    Outpatient Medications Prior to Visit  Medication Sig Dispense Refill   Ascorbic Acid (VITAMIN C PO) Take by mouth.     budesonide  (RHINOCORT AQUA) 32 MCG/ACT nasal spray Place 2 sprays into both nostrils daily.     Cholecalciferol (VITAMIN D3) 125 MCG (5000 UT) TABS Take 1 tablet by mouth daily.     Elagolix Sodium (ORILISSA) 150 MG TABS TAKE 1 TABLET (150MG ) BY MOUTH ONCE DAILY 90 tablet 2   EPINEPHrine 0.3 mg/0.3 mL IJ SOAJ injection SMARTSIG:Injection IM As Directed     levocetirizine (XYZAL) 5 MG tablet Take 5 mg by mouth every evening.  5   loratadine (CLARITIN) 10 MG tablet Take 10 mg by mouth daily.     meloxicam (MOBIC) 15 MG tablet Take 15 mg by mouth daily.     methylphenidate (RITALIN) 10 MG tablet Take 10 mg by mouth daily.     montelukast (SINGULAIR) 10 MG tablet Take 10 mg by mouth daily.  5   naproxen sodium (ANAPROX DS) 550 MG tablet Take 1 tablet (550 mg total) by mouth 2 (two) times daily with a meal. Prn sx 30 tablet 2   Omega-3 Fatty Acids (FISH OIL PO) Take by mouth.     SUNOSI 150 MG TABS Take 1 tablet by mouth daily.     No facility-administered medications prior to visit.      ROS:  Review of Systems BREAST: No symptoms   OBJECTIVE:   Vitals:  There were no vitals taken for this visit.  Physical Exam  Results: No results found for this or any previous visit (from the past 24 hour(s)).   Assessment/Plan: No diagnosis found.    No orders of the defined types were placed in this encounter.     No follow-ups on file.  Albertha Beattie B. Deauna Yaw, PA-C 06/16/2023 4:30 PM

## 2023-06-17 ENCOUNTER — Ambulatory Visit (INDEPENDENT_AMBULATORY_CARE_PROVIDER_SITE_OTHER): Payer: 59 | Admitting: Obstetrics and Gynecology

## 2023-06-17 ENCOUNTER — Encounter: Payer: Self-pay | Admitting: Obstetrics and Gynecology

## 2023-06-17 VITALS — BP 100/64 | Ht 65.0 in | Wt 136.0 lb

## 2023-06-17 DIAGNOSIS — N809 Endometriosis, unspecified: Secondary | ICD-10-CM

## 2023-06-17 DIAGNOSIS — R102 Pelvic and perineal pain unspecified side: Secondary | ICD-10-CM

## 2023-06-17 NOTE — Patient Instructions (Signed)
I value your feedback and you entrusting us with your care. If you get a [facility] patient survey, I would appreciate you taking the time to let us know about your experience today. Thank you! ? ? ?

## 2023-06-23 LAB — GENECONNECT MOLECULAR SCREEN: Genetic Analysis Overall Interpretation: NEGATIVE

## 2023-06-25 DIAGNOSIS — G47419 Narcolepsy without cataplexy: Secondary | ICD-10-CM | POA: Diagnosis not present

## 2023-07-04 ENCOUNTER — Telehealth: Payer: Self-pay | Admitting: Obstetrics and Gynecology

## 2023-07-04 ENCOUNTER — Ambulatory Visit (INDEPENDENT_AMBULATORY_CARE_PROVIDER_SITE_OTHER): Payer: 59

## 2023-07-04 DIAGNOSIS — N809 Endometriosis, unspecified: Secondary | ICD-10-CM

## 2023-07-04 DIAGNOSIS — R102 Pelvic and perineal pain unspecified side: Secondary | ICD-10-CM

## 2023-07-04 NOTE — Telephone Encounter (Signed)
LM with neg GYN u/s results. Pt with endometriosis. Has appt with Dr. Valentino Saxon for hyst consult 1/25.

## 2023-07-17 ENCOUNTER — Ambulatory Visit: Payer: 59 | Admitting: Obstetrics and Gynecology

## 2023-07-18 ENCOUNTER — Telehealth: Payer: Self-pay

## 2023-07-23 NOTE — Telephone Encounter (Signed)
 Spoke with pharmacy. Advised per 06/17/23 office visit note patient no longer taking Myfembree :  Pt has been on orilissa  for endometriosis since 2022. Stopped after 2 yrs 4/24; had pain quickly after cessation so changed to myfembree  after WNL DEXA 5/24. Myfembree  caused sadness/mood changes . After discussion with MD, put pt back on Orilissa  6/24.   Pharmacist advised rx last sent to patient 05/26/23. They will cancel rx.

## 2023-07-24 NOTE — Telephone Encounter (Signed)
Ok Thx 

## 2023-08-05 ENCOUNTER — Ambulatory Visit: Payer: 59 | Admitting: Obstetrics and Gynecology

## 2023-08-05 NOTE — Progress Notes (Deleted)
    GYNECOLOGY PROGRESS NOTE  Subjective:    Patient ID: Veronica Chan, female    DOB: 1985/10/18, 38 y.o.   MRN: [id number]  HPI  Patient is a 38 y.o. G0P0000 female who presents for follow up consultation for hysterectomy and endometriosis.  {Common ambulatory SmartLinks:19316}  Review of Systems {ros; complete:30496}   Objective:   There were no vitals taken for this visit. There is no height or weight on file to calculate BMI. General appearance: {general exam:16600} Abdomen: {abdominal exam:16834} Pelvic: {pelvic exam:16852::"cervix normal in appearance","external genitalia normal","no adnexal masses or tenderness","no cervical motion tenderness","rectovaginal septum normal","uterus normal size, shape, and consistency","vagina normal without discharge"} Extremities: {extremity exam:5109} Neurologic: {neuro exam:17854}   Assessment:   No diagnosis found.   Plan:   There are no diagnoses linked to this encounter.     Hildred Laser, MD [facility]

## 2023-08-19 NOTE — Progress Notes (Signed)
 GYNECOLOGY PROGRESS NOTE  Subjective:    Patient ID: Veronica Chan, female    DOB: February 06, 1986, 38 y.o.   MRN: [id number]  HPI  Patient is a 38 y.o. G0P0000 female who presents for follow up consultation for hysterectomy and endometriosis.  She was referred by Bernarda Schroeder, PA-C.  She was diagnosed in 2010 with endometriosis and notes that her symptoms have progressively worsened over time.  She has utilized treatment such as oral contraceptives, Aygestin , and Lupron therapy in the past for which she notes Lupron was the only successful therapy.  More recently, patient had been on Orilissa  for endometriosis since 2022.  Stopped after completion of therapy in April 2024 however her symptoms quickly returned so she was then transition to Myfembree .  After 2 months of use patient could no longer tolerate side effects of significant mood changes and was transition back to Orilissa  in June 2024 however notes that she did not get the same relief that she did prior to this.  Many of the other hormonal options cause her to feel very moody.  She currently has initiated a more natural approach with use of Elix supplements.  Reports that she has been on this regimen since November and is gradually starting to see an improvement in her symptoms.   Past Medical History:  Diagnosis Date   Acne    Anxiety 07/2019   BRCA negative    Chronic constipation    Endometriosis    Family history of breast cancer 03/2012   Pt's IBIS=34.2%10/15/  Pt's mom is MyRisk neg.   History of Papanicolaou smear of cervix 04/02/2013; 05/17/2014   neg; neg   Idiopathic hypersomnia    Lyme disease    Narcolepsy     Family History  Problem Relation Age of Onset   Breast cancer Mother 47       TAH/BSO BEFORE BR CA, IS MyRisk NEG   Colon polyps Mother    Thyroid  cancer Mother    Anxiety disorder Mother    Cancer Mother    COPD Mother    Diabetes Mother    Lung cancer Father    Diabetes Father    Breast cancer  Maternal Grandmother 66   Cancer Maternal Grandmother    Lung cancer Paternal Grandmother    COPD Paternal Grandfather    Cancer Paternal Aunt     Past Surgical History:  Procedure Laterality Date   AUGMENTATION MAMMAPLASTY Bilateral 2017   COLONOSCOPY WITH PROPOFOL  N/A 04/23/2016   Procedure: COLONOSCOPY WITH PROPOFOL ;  Surgeon: Gladis RAYMOND Mariner, MD;  Location: [facility] ENDOSCOPY;  Service: Endoscopy;  Laterality: N/A;   COSMETIC SURGERY  2018   DIAGNOSTIC LAPAROSCOPY  2010   DR. SCHERMERHORN - ENDOMETRIOSIS   ENDOMETRIAL BIOPSY     FOOT SURGERY Right    Social History   Socioeconomic History   Marital status: Married    Spouse name: Not on file   Number of children: Not on file   Years of education: 14   Highest education level: Not on file  Occupational History    Employer: [employer]  Tobacco Use   Smoking status: Former    Current packs/day: 0.00    Types: Cigarettes    Quit date: 12/13/2013    Years since quitting: 9.6   Smokeless tobacco: Former  Building Services Engineer status: Every Day  Substance and Sexual Activity   Alcohol use: Yes    Alcohol/week: 1.0 standard drink of alcohol  Types: 1 Glasses of wine per week    Comment: rarely   Drug use: No   Sexual activity: Yes    Birth control/protection: Condom, None  Other Topics Concern   Not on file  Social History Narrative   Not on file   Social Drivers of Health   Financial Resource Strain: Not on file  Food Insecurity: Not on file  Transportation Needs: Not on file  Physical Activity: Not on file  Stress: Not on file  Social Connections: Not on file  Intimate Partner Violence: Not on file    Current Outpatient Medications on File Prior to Visit  Medication Sig Dispense Refill   Ascorbic Acid (VITAMIN C PO) Take by mouth.     budesonide (RHINOCORT AQUA) 32 MCG/ACT nasal spray Place 2 sprays into both nostrils daily.     Cholecalciferol (VITAMIN D3) 125 MCG (5000 UT) TABS Take 1 tablet by mouth daily.      Elagolix Sodium  (ORILISSA ) 150 MG TABS TAKE 1 TABLET (150MG ) BY MOUTH ONCE DAILY 90 tablet 2   EPINEPHrine 0.3 mg/0.3 mL IJ SOAJ injection SMARTSIG:Injection IM As Directed     levocetirizine (XYZAL) 5 MG tablet Take 5 mg by mouth every evening.  5   loratadine (CLARITIN) 10 MG tablet Take 10 mg by mouth daily.     meloxicam (MOBIC) 15 MG tablet Take 15 mg by mouth daily.     methylphenidate (RITALIN) 10 MG tablet Take 10 mg by mouth daily.     montelukast (SINGULAIR) 10 MG tablet Take 10 mg by mouth daily.  5   Omega-3 Fatty Acids (FISH OIL PO) Take by mouth.     No current facility-administered medications on file prior to visit.    Allergies  Allergen Reactions   Modafinil Rash and Other (See Comments)    Review of Systems Pertinent items are noted in HPI.   Objective:   Blood pressure 109/73, pulse 82, height 5' 5 (1.651 m), weight 130 lb 3.2 oz (59.1 kg), last menstrual period 08/07/2023.  Body mass index is 21.67 kg/m. General appearance: alert, cooperative, and no distress Remainder of exam deferred   Assessment:   1. Endometriosis   2. Pelvic pain      Plan:   Lengthy discussion had with patient regarding options for continued management of endometriosis Currently trying new herbal supplement which appears to be working for her.  Notes that at this time she would hold off on consideration of hysterectomy. Felt like she had exhausted her options at the time she originally made her decision.  I discussed that she also has options of returning to Orilissa  at high dosing (200 mg, has typically only used 150 mg dosing), returning to Lupron injections, consideration of Danazol for hormonal suppression, or even have a repeat laparoscopy with excision of endometriosis (which she notes gave her approximately 2-3 years of relief when initially performed).  Patient happy that she does still have options at this time.  Advised to return if she reaches a plateau with natural  supplementation and would like to pursue any of the other options further.    A total of 30 minutes were spent during this encounter, including review of previous progress notes, recent imaging and labs, face-to-face with time with patient involving counseling and coordination of care, as well as documentation for current visit.   Archie Savers, MD [facility]

## 2023-08-20 ENCOUNTER — Ambulatory Visit (INDEPENDENT_AMBULATORY_CARE_PROVIDER_SITE_OTHER): Payer: 59 | Admitting: Obstetrics and Gynecology

## 2023-08-20 ENCOUNTER — Encounter: Payer: Self-pay | Admitting: Obstetrics and Gynecology

## 2023-08-20 VITALS — BP 109/73 | HR 82 | Ht 65.0 in | Wt 130.2 lb

## 2023-08-20 DIAGNOSIS — R102 Pelvic and perineal pain: Secondary | ICD-10-CM | POA: Diagnosis not present

## 2023-08-20 DIAGNOSIS — N809 Endometriosis, unspecified: Secondary | ICD-10-CM | POA: Diagnosis not present

## 2023-08-27 DIAGNOSIS — M546 Pain in thoracic spine: Secondary | ICD-10-CM | POA: Diagnosis not present

## 2023-09-08 DIAGNOSIS — M546 Pain in thoracic spine: Secondary | ICD-10-CM | POA: Diagnosis not present

## 2023-09-08 DIAGNOSIS — M545 Low back pain, unspecified: Secondary | ICD-10-CM | POA: Diagnosis not present

## 2023-09-16 DIAGNOSIS — G4711 Idiopathic hypersomnia with long sleep time: Secondary | ICD-10-CM | POA: Diagnosis not present

## 2023-09-17 DIAGNOSIS — M546 Pain in thoracic spine: Secondary | ICD-10-CM | POA: Diagnosis not present

## 2023-09-17 DIAGNOSIS — M545 Low back pain, unspecified: Secondary | ICD-10-CM | POA: Diagnosis not present

## 2023-10-01 DIAGNOSIS — M546 Pain in thoracic spine: Secondary | ICD-10-CM | POA: Diagnosis not present

## 2023-10-01 DIAGNOSIS — M545 Low back pain, unspecified: Secondary | ICD-10-CM | POA: Diagnosis not present

## 2023-10-08 DIAGNOSIS — M545 Low back pain, unspecified: Secondary | ICD-10-CM | POA: Diagnosis not present

## 2023-10-08 DIAGNOSIS — M546 Pain in thoracic spine: Secondary | ICD-10-CM | POA: Diagnosis not present

## 2023-10-20 ENCOUNTER — Encounter: Payer: Self-pay | Admitting: Nurse Practitioner

## 2023-10-20 ENCOUNTER — Ambulatory Visit (INDEPENDENT_AMBULATORY_CARE_PROVIDER_SITE_OTHER): Admitting: Nurse Practitioner

## 2023-10-20 VITALS — BP 97/62 | HR 76 | Temp 97.9°F | Resp 17 | Ht 65.0 in | Wt 126.2 lb

## 2023-10-20 DIAGNOSIS — Z136 Encounter for screening for cardiovascular disorders: Secondary | ICD-10-CM

## 2023-10-20 DIAGNOSIS — Z0000 Encounter for general adult medical examination without abnormal findings: Secondary | ICD-10-CM

## 2023-10-20 NOTE — Progress Notes (Signed)
 BP 97/62 (BP Location: Left Arm, Patient Position: Sitting, Cuff Size: Normal)   Pulse 76   Temp 97.9 F (36.6 C) (Oral)   Resp 17   Ht 5\' 5"  (1.651 m)   Wt 126 lb 3.2 oz (57.2 kg)   LMP 09/26/2023 (Exact Date)   SpO2 98%   BMI 21.00 kg/m    Subjective:    Patient ID: Veronica Chan, female    DOB: Mar 14, 1986, 38 y.o.   MRN: [id number]  HPI: Veronica Chan is a 38 y.o. female presenting on 10/20/2023 for comprehensive medical examination. Current medical complaints include:none  She currently lives with: Menopausal Symptoms: no    Denies HA, CP, SOB, dizziness, palpitations, visual changes, and lower extremity swelling.   Depression Screen done today and results listed below:     10/20/2023    3:20 PM 10/18/2022    8:32 AM 03/19/2022    9:09 AM 10/16/2021    8:32 AM 08/13/2021    4:20 PM  Depression screen PHQ 2/9  Decreased Interest 2 2 1  0 0  Down, Depressed, Hopeless 1 2 1  0 0  PHQ - 2 Score 3 4 2  0 0  Altered sleeping 2 2 2  0 0  Tired, decreased energy 3 2 2  0 0  Change in appetite 0 2 2 0 0  Feeling bad or failure about yourself  0 1 0 0 0  Trouble concentrating 1 1 0 0 0  Moving slowly or fidgety/restless 0 0 0 0 0  Suicidal thoughts 0 0 0 0 0  PHQ-9 Score 9 12 8  0 0  Difficult doing work/chores Somewhat difficult Very difficult Somewhat difficult Somewhat difficult Not difficult at all    The patient does not have a history of falls. I did complete a risk assessment for falls. A plan of care for falls was documented.   Past Medical History:  Past Medical History:  Diagnosis Date   Acne    Anxiety 07/2019   BRCA negative    Chronic constipation    Endometriosis    Family history of breast cancer 03/2012   Pt's IBIS=34.2%10/15/  Pt's mom is MyRisk neg.   History of Papanicolaou smear of cervix 04/02/2013; 05/17/2014   neg; neg   Idiopathic hypersomnia    Lyme disease    Narcolepsy     Surgical History:  Past Surgical History:  Procedure Laterality  Date   AUGMENTATION MAMMAPLASTY Bilateral 2017   COLONOSCOPY WITH PROPOFOL N/A 04/23/2016   Procedure: COLONOSCOPY WITH PROPOFOL;  Surgeon: Christena Deem, MD;  Location: [facility] ENDOSCOPY;  Service: Endoscopy;  Laterality: N/A;   COSMETIC SURGERY  2018   DIAGNOSTIC LAPAROSCOPY  2010   DR. SCHERMERHORN - ENDOMETRIOSIS   ENDOMETRIAL BIOPSY     FOOT SURGERY Right     Medications:  Current Outpatient Medications on File Prior to Visit  Medication Sig   Ascorbic Acid (VITAMIN C PO) Take by mouth.   budesonide (RHINOCORT AQUA) 32 MCG/ACT nasal spray Place 2 sprays into both nostrils daily.   Cholecalciferol (VITAMIN D3) 125 MCG (5000 UT) TABS Take 1 tablet by mouth daily.   Dextroamphetamine Sulfate 15 MG TABS Take 15 mg by mouth in the morning and at bedtime.   Elagolix Sodium (ORILISSA) 150 MG TABS TAKE 1 TABLET (150MG ) BY MOUTH ONCE DAILY   EPINEPHrine 0.3 mg/0.3 mL IJ SOAJ injection SMARTSIG:Injection IM As Directed   levocetirizine (XYZAL) 5 MG tablet Take 5 mg by mouth every evening.  loratadine (CLARITIN) 10 MG tablet Take 10 mg by mouth daily.   meloxicam (MOBIC) 15 MG tablet Take 15 mg by mouth daily.   montelukast (SINGULAIR) 10 MG tablet Take 10 mg by mouth daily.   Omega-3 Fatty Acids (FISH OIL PO) Take by mouth.   No current facility-administered medications on file prior to visit.    Allergies:  Allergies  Allergen Reactions   Modafinil Rash and Other (See Comments)    Social History:  Social History   Socioeconomic History   Marital status: Married    Spouse name: Not on file   Number of children: Not on file   Years of education: 14   Highest education level: Not on file  Occupational History    Employer: [employer]  Tobacco Use   Smoking status: Former    Current packs/day: 0.00    Average packs/day: 1 pack/day for 15.7 years (15.7 ttl pk-yrs)    Types: Cigarettes    Start date: 07/15/1998    Quit date: 03/14/2014    Years since quitting: 9.6   Smokeless  tobacco: Never  Vaping Use   Vaping status: Every Day   Substances: Nicotine  Substance and Sexual Activity   Alcohol use: Yes    Alcohol/week: 1.0 standard drink of alcohol    Types: 1 Glasses of wine per week    Comment: rarely   Drug use: Never   Sexual activity: Yes    Birth control/protection: Condom, None  Other Topics Concern   Not on file  Social History Narrative   Not on file   Social Drivers of Health   Financial Resource Strain: Low Risk  (10/20/2023)   Overall Financial Resource Strain (CARDIA)    Difficulty of Paying Living Expenses: Not hard at all  Food Insecurity: No Food Insecurity (10/20/2023)   Hunger Vital Sign    Worried About Running Out of Food in the Last Year: Never true    Ran Out of Food in the Last Year: Never true  Transportation Needs: No Transportation Needs (10/20/2023)   PRAPARE - Administrator, Civil Service (Medical): No    Lack of Transportation (Non-Medical): No  Physical Activity: Insufficiently Active (10/20/2023)   Exercise Vital Sign    Days of Exercise per Week: 3 days    Minutes of Exercise per Session: 20 min  Stress: No Stress Concern Present (10/20/2023)   [employer] of Occupational Health - Occupational Stress Questionnaire    Feeling of Stress : Not at all  Social Connections: Moderately Isolated (10/20/2023)   Social Connection and Isolation Panel [NHANES]    Frequency of Communication with Friends and Family: Twice a week    Frequency of Social Gatherings with Friends and Family: Once a week    Attends Religious Services: Never    Database administrator or Organizations: No    Attends Banker Meetings: Never    Marital Status: Married  Catering manager Violence: Not At Risk (10/20/2023)   Humiliation, Afraid, Rape, and Kick questionnaire    Fear of Current or Ex-Partner: No    Emotionally Abused: No    Physically Abused: No    Sexually Abused: No   Social History   Tobacco Use  Smoking Status  Former   Current packs/day: 0.00   Average packs/day: 1 pack/day for 15.7 years (15.7 ttl pk-yrs)   Types: Cigarettes   Start date: 07/15/1998   Quit date: 03/14/2014   Years since quitting: 9.6  Smokeless Tobacco  Never   Social History   Substance and Sexual Activity  Alcohol Use Yes   Alcohol/week: 1.0 standard drink of alcohol   Types: 1 Glasses of wine per week   Comment: rarely    Family History:  Family History  Problem Relation Age of Onset   Breast cancer Mother 74       TAH/BSO BEFORE BR CA, IS MyRisk NEG   Colon polyps Mother    Thyroid cancer Mother    Anxiety disorder Mother    Cancer Mother    COPD Mother    Diabetes Mother    Lung cancer Father    Diabetes Father    Breast cancer Maternal Grandmother 23   Cancer Maternal Grandmother    Lung cancer Paternal Grandmother    COPD Paternal Grandfather    Cancer Paternal Aunt     Past medical history, surgical history, medications, allergies, family history and social history reviewed with patient today and changes made to appropriate areas of the chart.   Review of Systems  Eyes:  Negative for blurred vision and double vision.  Respiratory:  Negative for shortness of breath.   Cardiovascular:  Negative for chest pain, palpitations and leg swelling.  Neurological:  Negative for dizziness and headaches.   All other ROS negative except what is listed above and in the HPI.      Objective:    BP 97/62 (BP Location: Left Arm, Patient Position: Sitting, Cuff Size: Normal)   Pulse 76   Temp 97.9 F (36.6 C) (Oral)   Resp 17   Ht 5\' 5"  (1.651 m)   Wt 126 lb 3.2 oz (57.2 kg)   LMP 09/26/2023 (Exact Date)   SpO2 98%   BMI 21.00 kg/m   Wt Readings from Last 3 Encounters:  10/20/23 126 lb 3.2 oz (57.2 kg)  08/20/23 130 lb 3.2 oz (59.1 kg)  06/17/23 136 lb (61.7 kg)    Physical Exam Vitals and nursing note reviewed.  Constitutional:      General: She is awake. She is not in acute distress.    Appearance:  Normal appearance. She is well-developed. She is not ill-appearing.  HENT:     Head: Normocephalic and atraumatic.     Right Ear: Hearing, tympanic membrane, ear canal and external ear normal. No drainage.     Left Ear: Hearing, tympanic membrane, ear canal and external ear normal. No drainage.     Nose: Nose normal.     Right Sinus: No maxillary sinus tenderness or frontal sinus tenderness.     Left Sinus: No maxillary sinus tenderness or frontal sinus tenderness.     Mouth/Throat:     Mouth: Mucous membranes are moist.     Pharynx: Oropharynx is clear. Uvula midline. No pharyngeal swelling, oropharyngeal exudate or posterior oropharyngeal erythema.  Eyes:     General: Lids are normal.        Right eye: No discharge.        Left eye: No discharge.     Extraocular Movements: Extraocular movements intact.     Conjunctiva/sclera: Conjunctivae normal.     Pupils: Pupils are equal, round, and reactive to light.     Visual Fields: Right eye visual fields normal and left eye visual fields normal.  Neck:     Thyroid: No thyromegaly.     Vascular: No carotid bruit.     Trachea: Trachea normal.  Cardiovascular:     Rate and Rhythm: Normal rate and regular rhythm.  Heart sounds: Normal heart sounds. No murmur heard.    No gallop.  Pulmonary:     Effort: Pulmonary effort is normal. No accessory muscle usage or respiratory distress.     Breath sounds: Normal breath sounds.  Chest:  Breasts:    Right: Normal.     Left: Normal.  Abdominal:     General: Bowel sounds are normal.     Palpations: Abdomen is soft. There is no hepatomegaly or splenomegaly.     Tenderness: There is no abdominal tenderness.  Musculoskeletal:        General: Normal range of motion.     Cervical back: Normal range of motion and neck supple.     Right lower leg: No edema.     Left lower leg: No edema.  Lymphadenopathy:     Head:     Right side of head: No submental, submandibular, tonsillar, preauricular or  posterior auricular adenopathy.     Left side of head: No submental, submandibular, tonsillar, preauricular or posterior auricular adenopathy.     Cervical: No cervical adenopathy.     Upper Body:     Right upper body: No supraclavicular, axillary or pectoral adenopathy.     Left upper body: No supraclavicular, axillary or pectoral adenopathy.  Skin:    General: Skin is warm and dry.     Capillary Refill: Capillary refill takes less than 2 seconds.     Findings: No rash.  Neurological:     Mental Status: She is alert and oriented to person, place, and time.     Gait: Gait is intact.  Psychiatric:        Attention and Perception: Attention normal.        Mood and Affect: Mood normal.        Speech: Speech normal.        Behavior: Behavior normal. Behavior is cooperative.        Thought Content: Thought content normal.        Judgment: Judgment normal.     Results for orders placed or performed during the hospital encounter of 06/09/23  [facility] Molecular Screen- Blood ( Clinical Lab)   Collection Time: 06/09/23  8:32 AM  Result Value Ref Range   Genetic Analysis Overall Interpretation Negative    Genetic Disease Assessed      [facility] Tier One Population Screen is a screening test that analyzes 11 genes related to hereditary breast and ovarian cancer (HBOC) syndrome, Lynch syndrome, and familial hypercholesterolemia. This test only reports clinically significant pathogenic and  likely pathogenic variants but does not report variants of uncertain significance (VUS). In addition, analysis of the PMS2 gene excludes exons 11-15, which overlap with a known pseudogene (PMS2CL).    Genetic Analysis Report      No pathogenic or likely pathogenic variants were detected in the genes analyzed by this test.Genetic test results should be interpreted in the context of an individual's personal medical and family history. Alteration to medical management is NOT  recommended based solely on this  result. Clinical correlation is advised.Additional Considerations- This is a screening test; individuals may still carry pathogenic or likely pathogenic variant(s) in the tested genes that are not detected by this test.-  For individuals at risk for these or other related conditions based on factors including personal or family history, diagnostic testing is recommended.- The absence of pathogenic or likely pathogenic variant(s) in the analyzed genes, while reassuring,  does not eliminate the possibility of a hereditary condition; there  are other variants and genes associated with heart disease and hereditary cancer that are not included in this test.    Genes Tested See Notes    Disclaimer See Notes    Sequencing Location See Notes    Interpretation Methods and Limitations See Notes       Assessment & Plan:   Problem List Items Addressed This Visit   None Visit Diagnoses       Annual physical exam    -  Primary   Health maintenance reviewed during visit today.  Labs ordered.  Vaccines reviewed.  PAP up to date.   Relevant Orders   CBC with Differential/Platelet   Comprehensive metabolic panel with GFR   Lipid panel   TSH     Screening for ischemic heart disease       Relevant Orders   Lipid panel         Follow up plan: No follow-ups on file.   LABORATORY TESTING:  - Pap smear: pap done  IMMUNIZATIONS:   - Tdap: Tetanus vaccination status reviewed: last tetanus booster within 10 years. - Influenza: Postponed to flu season - Pneumovax: Not applicable - Prevnar: Not applicable - COVID: Not applicable - HPV: Not applicable - Shingrix vaccine: Not applicable  SCREENING: -Mammogram: Not applicable  - Colonoscopy: Not applicable  - Bone Density: Not applicable  -Hearing Test: Not applicable  -Spirometry: Not applicable   PATIENT COUNSELING:   Advised to take 1 mg of folate supplement per day if capable of pregnancy.   Sexuality: Discussed sexually transmitted  diseases, partner selection, use of condoms, avoidance of unintended pregnancy  and contraceptive alternatives.   Advised to avoid cigarette smoking.  I discussed with the patient that most people either abstain from alcohol or drink within safe limits (<=14/week and <=4 drinks/occasion for males, <=7/weeks and <= 3 drinks/occasion for females) and that the risk for alcohol disorders and other health effects rises proportionally with the number of drinks per week and how often a drinker exceeds daily limits.  Discussed cessation/primary prevention of drug use and availability of treatment for abuse.   Diet: Encouraged to adjust caloric intake to maintain  or achieve ideal body weight, to reduce intake of dietary saturated fat and total fat, to limit sodium intake by avoiding high sodium foods and not adding table salt, and to maintain adequate dietary potassium and calcium preferably from fresh fruits, vegetables, and low-fat dairy products.    stressed the importance of regular exercise  Injury prevention: Discussed safety belts, safety helmets, smoke detector, smoking near bedding or upholstery.   Dental health: Discussed importance of regular tooth brushing, flossing, and dental visits.    NEXT PREVENTATIVE PHYSICAL DUE IN 1 YEAR. No follow-ups on file.

## 2023-10-21 ENCOUNTER — Encounter: Payer: Self-pay | Admitting: Nurse Practitioner

## 2023-10-21 DIAGNOSIS — G4711 Idiopathic hypersomnia with long sleep time: Secondary | ICD-10-CM | POA: Diagnosis not present

## 2023-10-21 LAB — COMPREHENSIVE METABOLIC PANEL WITH GFR
ALT: 14 IU/L (ref 0–32)
AST: 25 IU/L (ref 0–40)
Albumin: 4.6 g/dL (ref 3.9–4.9)
Alkaline Phosphatase: 31 IU/L — ABNORMAL LOW (ref 44–121)
BUN/Creatinine Ratio: 14 (ref 9–23)
BUN: 10 mg/dL (ref 6–20)
Bilirubin Total: <0.2 mg/dL (ref 0.0–1.2)
CO2: 23 mmol/L (ref 20–29)
Calcium: 9.3 mg/dL (ref 8.7–10.2)
Chloride: 102 mmol/L (ref 96–106)
Creatinine, Ser: 0.73 mg/dL (ref 0.57–1.00)
Globulin, Total: 2.3 g/dL (ref 1.5–4.5)
Glucose: 69 mg/dL — ABNORMAL LOW (ref 70–99)
Potassium: 4.3 mmol/L (ref 3.5–5.2)
Sodium: 141 mmol/L (ref 134–144)
Total Protein: 6.9 g/dL (ref 6.0–8.5)
eGFR: 108 mL/min/{1.73_m2} (ref >59)

## 2023-10-21 LAB — LIPID PANEL
Chol/HDL Ratio: 2.8 ratio (ref 0.0–4.4)
Cholesterol, Total: 172 mg/dL (ref 100–199)
HDL: 61 mg/dL (ref >39)
LDL Chol Calc (NIH): 95 mg/dL (ref 0–99)
Triglycerides: 86 mg/dL (ref 0–149)
VLDL Cholesterol Cal: 16 mg/dL (ref 5–40)

## 2023-10-21 LAB — CBC WITH DIFFERENTIAL/PLATELET
Basophils Absolute: 0 10*3/uL (ref 0.0–0.2)
Basos: 0 %
EOS (ABSOLUTE): 0.1 10*3/uL (ref 0.0–0.4)
Eos: 1 %
Hematocrit: 36.9 % (ref 34.0–46.6)
Hemoglobin: 12.6 g/dL (ref 11.1–15.9)
Immature Grans (Abs): 0 10*3/uL (ref 0.0–0.1)
Immature Granulocytes: 0 %
Lymphocytes Absolute: 1.9 10*3/uL (ref 0.7–3.1)
Lymphs: 41 %
MCH: 33 pg (ref 26.6–33.0)
MCHC: 34.1 g/dL (ref 31.5–35.7)
MCV: 97 fL (ref 79–97)
Monocytes Absolute: 0.3 10*3/uL (ref 0.1–0.9)
Monocytes: 7 %
Neutrophils Absolute: 2.4 10*3/uL (ref 1.4–7.0)
Neutrophils: 51 %
Platelets: 274 10*3/uL (ref 150–450)
RBC: 3.82 x10E6/uL (ref 3.77–5.28)
RDW: 13.1 % (ref 11.7–15.4)
WBC: 4.7 10*3/uL (ref 3.4–10.8)

## 2023-10-21 LAB — TSH: TSH: 3.01 u[IU]/mL (ref 0.450–4.500)

## 2023-12-10 DIAGNOSIS — G4711 Idiopathic hypersomnia with long sleep time: Secondary | ICD-10-CM | POA: Diagnosis not present

## 2023-12-25 ENCOUNTER — Other Ambulatory Visit: Payer: Self-pay | Admitting: Obstetrics and Gynecology

## 2023-12-25 DIAGNOSIS — Z1231 Encounter for screening mammogram for malignant neoplasm of breast: Secondary | ICD-10-CM

## 2024-01-14 ENCOUNTER — Ambulatory Visit
Admission: RE | Admit: 2024-01-14 | Discharge: 2024-01-14 | Disposition: A | Discharge status: Home / Self Care | Source: Ambulatory Visit | Attending: Obstetrics and Gynecology | Admitting: Obstetrics and Gynecology

## 2024-01-14 DIAGNOSIS — Z1231 Encounter for screening mammogram for malignant neoplasm of breast: Secondary | ICD-10-CM | POA: Diagnosis not present

## 2024-01-14 DIAGNOSIS — G4711 Idiopathic hypersomnia with long sleep time: Secondary | ICD-10-CM | POA: Diagnosis not present

## 2024-01-17 ENCOUNTER — Other Ambulatory Visit: Payer: Self-pay | Admitting: Obstetrics and Gynecology

## 2024-01-17 DIAGNOSIS — N809 Endometriosis, unspecified: Secondary | ICD-10-CM

## 2024-01-19 ENCOUNTER — Ambulatory Visit: Payer: Self-pay | Admitting: Obstetrics and Gynecology

## 2024-01-20 ENCOUNTER — Encounter: Payer: Self-pay | Admitting: Obstetrics and Gynecology

## 2024-01-22 ENCOUNTER — Other Ambulatory Visit: Payer: Self-pay | Admitting: Obstetrics and Gynecology

## 2024-01-22 DIAGNOSIS — N809 Endometriosis, unspecified: Secondary | ICD-10-CM

## 2024-01-22 MED ORDER — ORILISSA 200 MG PO TABS: 200.0000 mg | ORAL_TABLET | Freq: Every day | ORAL | 2 refills | Status: DC

## 2024-01-22 NOTE — Telephone Encounter (Signed)
 Pls be on lookout for PA. Thx.

## 2024-01-22 NOTE — Progress Notes (Signed)
 Rx orilissa  200 mg dose for endometriosis, recommended by Dr. Connell. RTO in 3 months for sx f/u and annual

## 2024-01-23 ENCOUNTER — Other Ambulatory Visit: Payer: Self-pay | Admitting: Obstetrics and Gynecology

## 2024-01-25 ENCOUNTER — Other Ambulatory Visit: Payer: Self-pay | Admitting: Obstetrics and Gynecology

## 2024-01-25 MED ORDER — ORILISSA 200 MG PO TABS: 200.0000 mg | ORAL_TABLET | Freq: Two times a day (BID) | ORAL | 2 refills | Status: AC

## 2024-03-17 DIAGNOSIS — G4711 Idiopathic hypersomnia with long sleep time: Secondary | ICD-10-CM | POA: Diagnosis not present

## 2024-05-26 DIAGNOSIS — G4711 Idiopathic hypersomnia with long sleep time: Secondary | ICD-10-CM | POA: Diagnosis not present

## 2024-07-27 ENCOUNTER — Ambulatory Visit: Payer: Self-pay | Admitting: Nurse Practitioner

## 2024-07-27 ENCOUNTER — Encounter: Payer: Self-pay | Admitting: Nurse Practitioner

## 2024-07-27 VITALS — BP 112/72 | HR 83 | Temp 98.2°F | Ht 65.0 in | Wt 128.6 lb

## 2024-07-27 DIAGNOSIS — G5601 Carpal tunnel syndrome, right upper limb: Secondary | ICD-10-CM | POA: Diagnosis not present

## 2024-07-27 NOTE — Progress Notes (Signed)
 "  BP 112/72 (BP Location: Right Arm, Patient Position: Sitting, Cuff Size: Normal)   Pulse 83   Temp 98.2 F (36.8 C) (Oral)   Ht 5' 5 (1.651 m)   Wt 128 lb 9.6 oz (58.3 kg)   LMP 07/17/2024 (Exact Date)   SpO2 100%   BMI 21.40 kg/m    Subjective:    Patient ID: Veronica Chan, female    DOB: 10-Feb-1986, 39 y.o.   MRN: [id number]  HPI: Veronica Chan is a 38 y.o. female  Chief Complaint  Patient presents with   Wrist Pain    Patient's right wrist has gotten worse, she stated there is some numbness and tingling in her right hand as well   HAND NUMBNESS Patient states her hand is falling asleep at night.  No matter what position she puts her hand in it falls asleep.  Sometimes happens during the day depending on the position she puts her wrist in.  The numbness wakes her up at night.  She was wearing a wrist brace for a little while and that seems to help but no longer seems to be helping.  It is her right hand.  States occasionally it happens on her left but not everyday like her right.     Relevant past medical, surgical, family and social history reviewed and updated as indicated. Interim medical history since our last visit reviewed. Allergies and medications reviewed and updated.  Review of Systems  Neurological:        Numbness and tingling in right wrist    Per HPI unless specifically indicated above     Objective:    BP 112/72 (BP Location: Right Arm, Patient Position: Sitting, Cuff Size: Normal)   Pulse 83   Temp 98.2 F (36.8 C) (Oral)   Ht 5' 5 (1.651 m)   Wt 128 lb 9.6 oz (58.3 kg)   LMP 07/17/2024 (Exact Date)   SpO2 100%   BMI 21.40 kg/m   Wt Readings from Last 3 Encounters:  07/27/24 128 lb 9.6 oz (58.3 kg)  10/20/23 126 lb 3.2 oz (57.2 kg)  08/20/23 130 lb 3.2 oz (59.1 kg)    Physical Exam Vitals and nursing note reviewed.  Constitutional:      General: She is not in acute distress.    Appearance: Normal appearance. She is normal weight.  She is not ill-appearing, toxic-appearing or diaphoretic.  HENT:     Head: Normocephalic.     Right Ear: External ear normal.     Left Ear: External ear normal.     Nose: Nose normal.     Mouth/Throat:     Mouth: Mucous membranes are moist.     Pharynx: Oropharynx is clear.  Eyes:     General:        Right eye: No discharge.        Left eye: No discharge.     Extraocular Movements: Extraocular movements intact.     Conjunctiva/sclera: Conjunctivae normal.     Pupils: Pupils are equal, round, and reactive to light.  Cardiovascular:     Rate and Rhythm: Normal rate and regular rhythm.     Heart sounds: No murmur heard. Pulmonary:     Effort: Pulmonary effort is normal. No respiratory distress.     Breath sounds: Normal breath sounds. No wheezing or rales.  Musculoskeletal:     Right wrist: Normal.     Cervical back: Normal range of motion and neck supple.  Skin:  General: Skin is warm and dry.     Capillary Refill: Capillary refill takes less than 2 seconds.  Neurological:     General: No focal deficit present.     Mental Status: She is alert and oriented to person, place, and time. Mental status is at baseline.  Psychiatric:        Mood and Affect: Mood normal.        Behavior: Behavior normal.        Thought Content: Thought content normal.        Judgment: Judgment normal.     Results for orders placed or performed in visit on 10/20/23  CBC with Differential/Platelet   Collection Time: 10/20/23  3:35 PM  Result Value Ref Range   WBC 4.7 3.4 - 10.8 x10E3/uL   RBC 3.82 3.77 - 5.28 x10E6/uL   Hemoglobin 12.6 11.1 - 15.9 g/dL   Hematocrit 63.0 65.9 - 46.6 %   MCV 97 79 - 97 fL   MCH 33.0 26.6 - 33.0 pg   MCHC 34.1 31.5 - 35.7 g/dL   RDW 86.8 88.2 - 84.5 %   Platelets 274 150 - 450 x10E3/uL   Neutrophils 51 Not Estab. %   Lymphs 41 Not Estab. %   Monocytes 7 Not Estab. %   Eos 1 Not Estab. %   Basos 0 Not Estab. %   Neutrophils Absolute 2.4 1.4 - 7.0 x10E3/uL    Lymphocytes Absolute 1.9 0.7 - 3.1 x10E3/uL   Monocytes Absolute 0.3 0.1 - 0.9 x10E3/uL   EOS (ABSOLUTE) 0.1 0.0 - 0.4 x10E3/uL   Basophils Absolute 0.0 0.0 - 0.2 x10E3/uL   Immature Granulocytes 0 Not Estab. %   Immature Grans (Abs) 0.0 0.0 - 0.1 x10E3/uL  Comprehensive metabolic panel with GFR   Collection Time: 10/20/23  3:35 PM  Result Value Ref Range   Glucose 69 (L) 70 - 99 mg/dL   BUN 10 6 - 20 mg/dL   Creatinine, Ser 9.26 0.57 - 1.00 mg/dL   eGFR 891 >40 fO/fpw/8.26   BUN/Creatinine Ratio 14 9 - 23   Sodium 141 134 - 144 mmol/L   Potassium 4.3 3.5 - 5.2 mmol/L   Chloride 102 96 - 106 mmol/L   CO2 23 20 - 29 mmol/L   Calcium 9.3 8.7 - 10.2 mg/dL   Total Protein 6.9 6.0 - 8.5 g/dL   Albumin 4.6 3.9 - 4.9 g/dL   Globulin, Total 2.3 1.5 - 4.5 g/dL   Bilirubin Total <9.7 0.0 - 1.2 mg/dL   Alkaline Phosphatase 31 (L) 44 - 121 IU/L   AST 25 0 - 40 IU/L   ALT 14 0 - 32 IU/L  Lipid panel   Collection Time: 10/20/23  3:35 PM  Result Value Ref Range   Cholesterol, Total 172 100 - 199 mg/dL   Triglycerides 86 0 - 149 mg/dL   HDL 61 >60 mg/dL   VLDL Cholesterol Cal 16 5 - 40 mg/dL   LDL Chol Calc (NIH) 95 0 - 99 mg/dL   Chol/HDL Ratio 2.8 0.0 - 4.4 ratio  TSH   Collection Time: 10/20/23  3:35 PM  Result Value Ref Range   TSH 3.010 0.450 - 4.500 uIU/mL      Assessment & Plan:   Problem List Items Addressed This Visit   None Visit Diagnoses       Carpal tunnel syndrome of right wrist    -  Primary   Numbness and tingling mostly at night. Not resolved  with wrist brace.  Referral placed for ortho for evaluation and management of symptoms.   Relevant Orders   Ambulatory referral to Orthopedic Surgery        Follow up plan: Return if symptoms worsen or fail to improve.      "

## 2024-08-04 ENCOUNTER — Ambulatory Visit (INDEPENDENT_AMBULATORY_CARE_PROVIDER_SITE_OTHER)

## 2024-08-04 ENCOUNTER — Ambulatory Visit

## 2024-08-04 VITALS — BP 87/57 | HR 81 | Ht 65.0 in | Wt 128.0 lb

## 2024-08-04 DIAGNOSIS — R202 Paresthesia of skin: Secondary | ICD-10-CM | POA: Diagnosis not present

## 2024-08-04 DIAGNOSIS — G5601 Carpal tunnel syndrome, right upper limb: Secondary | ICD-10-CM

## 2024-08-04 DIAGNOSIS — R200 Anesthesia of skin: Secondary | ICD-10-CM | POA: Diagnosis not present

## 2024-08-04 NOTE — Progress Notes (Signed)
 "  Office Visit Note   Patient: Veronica Chan           Date of Birth: 03-Jul-1986           MRN: [id number] Visit Date: 08/04/2024              Requested by: Melvin Pao, NP [address] [place],  [place] [address] PCP: Melvin Pao, NP   Assessment & Plan: Visit Diagnoses:  1. Carpal tunnel syndrome, right upper limb     Plan: Suspect carpal tunnel syndrome, worsening despite night bracing. Referred for an EMG and will follow up after for evaluation.  Orders:  Orders Placed This Encounter  Procedures   DG Wrist Complete Right   Ambulatory referral to Neurology     Subjective: Chief Complaint: Right wrist pain  HPI Patient is a 39 y.o. year old female who presents with numbness and tingling of the right hand. Onset of the symptoms was many months ago. Inciting event: none known. Current symptoms include numbness and tingling of the thumb, index, and middle finger. Patient has been using a night brace which she states helped at first but stopped hellping Patient has had no prior wrist problems. Treatment to date: night bracing.  Objective: Vital Signs: BP (!) 87/57   Pulse 81   Ht 5' 5 (1.651 m)   Wt 58.1 kg   LMP 07/17/2024 (Exact Date)   BMI 21.30 kg/m   Physical Exam Gen: Alert, No Acute Distress right wrist: Skin intact, no erythema or induration noted. positive Phalen, positive Tinel, remainder of ipsilateral wrist, hand and finger exam is normal  Imaging: Radiographs personally reviewed by me; reveal no abnormalities of the right wrist.   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   History of Papanicolaou smear of cervix 04/02/2013; 05/17/2014   neg; neg   Idiopathic hypersomnia    Lyme disease    Narcolepsy     Family History  Problem Relation Age of Onset   Breast cancer Mother 42       TAH/BSO BEFORE BR CA, IS MyRisk NEG   Colon polyps Mother    Thyroid  cancer Mother    Anxiety disorder Mother    Cancer Mother    COPD Mother    Diabetes Mother    Lung cancer Father    Diabetes Father    Cancer Paternal Aunt    Breast cancer Maternal Grandmother 64   Cancer Maternal Grandmother    Lung cancer Paternal Grandmother    COPD Paternal Grandfather    Breast cancer Maternal Aunt 72    Past Surgical History:  Procedure Laterality Date   AUGMENTATION MAMMAPLASTY Bilateral 2017   COLONOSCOPY WITH  PROPOFOL  N/A 04/23/2016   Procedure: COLONOSCOPY WITH PROPOFOL ;  Surgeon: Gladis RAYMOND Mariner, MD;  Location: [facility];  Service: Endoscopy;  Laterality: N/A;   COSMETIC SURGERY  2018   DIAGNOSTIC LAPAROSCOPY  2010   DR. SCHERMERHORN - ENDOMETRIOSIS   ENDOMETRIAL BIOPSY     FOOT SURGERY Right    Social History   Occupational History    Employer: [employer]  Tobacco Use   Smoking status: Former    Current packs/day: 0.00    Average packs/day: 1 pack/day for 15.7 years (15.7 ttl pk-yrs)    Types: Cigarettes    Start date: 07/15/1998    Quit date: 03/14/2014    Years since quitting: 10.4   Smokeless tobacco: Never  Vaping Use   Vaping status: Every Day   Substances: Nicotine  Substance and Sexual Activity   Alcohol use: Yes    Alcohol/week: 1.0 standard  drink of alcohol    Types: 1 Glasses of wine per week    Comment: rarely   Drug use: Never   Sexual activity: Yes    Birth control/protection: Condom, None   Current Outpatient Medications  Medication Instructions   Ascorbic Acid (VITAMIN C PO) Take by mouth.   budesonide (RHINOCORT AQUA) 32 MCG/ACT nasal spray 2 sprays, Daily   Cholecalciferol (VITAMIN D3) 125 MCG (5000 UT) TABS 1 tablet, Daily   Dextroamphetamine Sulfate 15 mg, 2 times daily   EPINEPHrine 0.3 mg/0.3 mL IJ SOAJ injection SMARTSIG:Injection IM As Directed   fluticasone (FLONASE) 50 MCG/ACT nasal spray As needed   levocetirizine (XYZAL) 5 mg, Every evening   loratadine (CLARITIN) 10 mg, Daily   meloxicam (MOBIC) 15 mg, Daily   montelukast (SINGULAIR) 10 mg, Daily   Omega-3 Fatty Acids (FISH OIL PO) Take by mouth.   Orilissa  200 mg, Oral, 2 times daily   Allergies as of 08/04/2024 - Review Complete 07/27/2024  Allergen Reaction Noted   Modafinil Rash and Other (See Comments) 05/22/2020   "
